# Patient Record
Sex: Female | Born: 1970
Health system: Southern US, Community
[De-identification: ages and names within clinical notes are randomized; demographics above are authoritative.]

## PROBLEM LIST (undated history)

## (undated) DIAGNOSIS — E785 Hyperlipidemia, unspecified: Secondary | ICD-10-CM

## (undated) DIAGNOSIS — M109 Gout, unspecified: Secondary | ICD-10-CM

## (undated) DIAGNOSIS — R51 Headache: Secondary | ICD-10-CM

## (undated) DIAGNOSIS — E559 Vitamin D deficiency, unspecified: Secondary | ICD-10-CM

## (undated) DIAGNOSIS — I10 Essential (primary) hypertension: Secondary | ICD-10-CM

## (undated) DIAGNOSIS — K219 Gastro-esophageal reflux disease without esophagitis: Secondary | ICD-10-CM

## (undated) DIAGNOSIS — F419 Anxiety disorder, unspecified: Principal | ICD-10-CM

## (undated) DIAGNOSIS — E039 Hypothyroidism, unspecified: Secondary | ICD-10-CM

## (undated) DIAGNOSIS — F329 Major depressive disorder, single episode, unspecified: Secondary | ICD-10-CM

## (undated) DIAGNOSIS — Z87442 Personal history of urinary calculi: Secondary | ICD-10-CM

## (undated) DIAGNOSIS — E119 Type 2 diabetes mellitus without complications: Secondary | ICD-10-CM

## (undated) DIAGNOSIS — E669 Obesity, unspecified: Secondary | ICD-10-CM

## (undated) DIAGNOSIS — Z309 Encounter for contraceptive management, unspecified: Secondary | ICD-10-CM

## (undated) HISTORY — DX: Headache: R51

## (undated) HISTORY — DX: Major depressive disorder, single episode, unspecified: F32.9

## (undated) HISTORY — DX: Hyperlipidemia, unspecified: E78.5

## (undated) HISTORY — DX: Gout, unspecified: M10.9

## (undated) HISTORY — DX: Encounter for contraceptive management, unspecified: Z30.9

## (undated) HISTORY — PX: CHOLECYSTECTOMY: SHX55

## (undated) HISTORY — DX: Essential (primary) hypertension: I10

## (undated) HISTORY — DX: Anxiety disorder, unspecified: F41.9

## (undated) HISTORY — DX: Vitamin D deficiency, unspecified: E55.9

## (undated) HISTORY — DX: Hypothyroidism, unspecified: E03.9

## (undated) HISTORY — DX: Type 2 diabetes mellitus without complications: E11.9

## (undated) HISTORY — DX: Obesity, unspecified: E66.9

## (undated) HISTORY — PX: GALLBLADDER SURGERY: SHX652

---

## 2003-03-15 ENCOUNTER — Ambulatory Visit (HOSPITAL_COMMUNITY): Admission: RE | Admit: 2003-03-15 | Discharge: 2003-03-15 | Payer: Self-pay | Admitting: Family Medicine

## 2004-06-16 ENCOUNTER — Encounter: Admission: RE | Admit: 2004-06-16 | Discharge: 2004-08-18 | Payer: Self-pay | Admitting: Physician Assistant

## 2004-08-18 ENCOUNTER — Encounter: Admission: RE | Admit: 2004-08-18 | Discharge: 2004-11-16 | Payer: Self-pay | Admitting: Physician Assistant

## 2004-11-01 ENCOUNTER — Ambulatory Visit (HOSPITAL_COMMUNITY): Admission: RE | Admit: 2004-11-01 | Discharge: 2004-11-01 | Payer: Self-pay | Admitting: Family Medicine

## 2004-11-17 ENCOUNTER — Encounter: Admission: RE | Admit: 2004-11-17 | Discharge: 2005-02-15 | Payer: Self-pay | Admitting: Physician Assistant

## 2005-02-16 ENCOUNTER — Encounter: Admission: RE | Admit: 2005-02-16 | Discharge: 2005-05-17 | Payer: Self-pay | Admitting: Physician Assistant

## 2005-05-18 ENCOUNTER — Encounter: Admission: RE | Admit: 2005-05-18 | Discharge: 2005-08-16 | Payer: Self-pay | Admitting: Physician Assistant

## 2007-07-27 ENCOUNTER — Other Ambulatory Visit: Admission: RE | Admit: 2007-07-27 | Discharge: 2007-07-27 | Payer: Self-pay | Admitting: Obstetrics and Gynecology

## 2008-08-01 ENCOUNTER — Other Ambulatory Visit: Admission: RE | Admit: 2008-08-01 | Discharge: 2008-08-01 | Payer: Self-pay | Admitting: Obstetrics and Gynecology

## 2008-11-09 ENCOUNTER — Emergency Department (HOSPITAL_COMMUNITY): Admission: EM | Admit: 2008-11-09 | Discharge: 2008-11-09 | Payer: Self-pay | Admitting: Family Medicine

## 2009-08-14 ENCOUNTER — Other Ambulatory Visit: Admission: RE | Admit: 2009-08-14 | Discharge: 2009-08-14 | Payer: Self-pay | Admitting: Obstetrics and Gynecology

## 2010-07-17 LAB — POCT URINALYSIS DIP (DEVICE)
Glucose, UA: 250 mg/dL — AB
Ketones, ur: 15 mg/dL — AB
Nitrite: POSITIVE — AB
Protein, ur: 100 mg/dL — AB
Specific Gravity, Urine: 1.015 (ref 1.005–1.030)
Urobilinogen, UA: 4 mg/dL — ABNORMAL HIGH (ref 0.0–1.0)
pH: 5 (ref 5.0–8.0)

## 2010-07-17 LAB — POCT PREGNANCY, URINE: Preg Test, Ur: NEGATIVE

## 2010-07-17 LAB — URINE CULTURE: Colony Count: 2000

## 2010-08-20 ENCOUNTER — Other Ambulatory Visit (HOSPITAL_COMMUNITY)
Admission: RE | Admit: 2010-08-20 | Discharge: 2010-08-20 | Disposition: A | Discharge status: Home / Self Care | Payer: BC Managed Care – PPO | Source: Ambulatory Visit | Attending: Obstetrics and Gynecology | Admitting: Obstetrics and Gynecology

## 2010-08-20 DIAGNOSIS — Z01419 Encounter for gynecological examination (general) (routine) without abnormal findings: Secondary | ICD-10-CM | POA: Insufficient documentation

## 2012-10-04 ENCOUNTER — Encounter: Payer: Self-pay | Admitting: *Deleted

## 2012-10-05 ENCOUNTER — Encounter: Payer: Self-pay | Admitting: Adult Health

## 2012-10-05 ENCOUNTER — Other Ambulatory Visit (HOSPITAL_COMMUNITY)
Admission: RE | Admit: 2012-10-05 | Discharge: 2012-10-05 | Disposition: A | Discharge status: Home / Self Care | Payer: BC Managed Care – PPO | Source: Ambulatory Visit | Attending: Adult Health | Admitting: Adult Health

## 2012-10-05 ENCOUNTER — Ambulatory Visit (INDEPENDENT_AMBULATORY_CARE_PROVIDER_SITE_OTHER): Payer: BC Managed Care – PPO | Admitting: Adult Health

## 2012-10-05 VITALS — BP 130/90 | HR 72 | Ht 71.5 in | Wt 382.2 lb

## 2012-10-05 DIAGNOSIS — Z309 Encounter for contraceptive management, unspecified: Secondary | ICD-10-CM

## 2012-10-05 DIAGNOSIS — E7800 Pure hypercholesterolemia, unspecified: Secondary | ICD-10-CM

## 2012-10-05 DIAGNOSIS — I10 Essential (primary) hypertension: Secondary | ICD-10-CM | POA: Insufficient documentation

## 2012-10-05 DIAGNOSIS — Z1212 Encounter for screening for malignant neoplasm of rectum: Secondary | ICD-10-CM

## 2012-10-05 DIAGNOSIS — Z1151 Encounter for screening for human papillomavirus (HPV): Secondary | ICD-10-CM | POA: Insufficient documentation

## 2012-10-05 DIAGNOSIS — Z01419 Encounter for gynecological examination (general) (routine) without abnormal findings: Secondary | ICD-10-CM

## 2012-10-05 DIAGNOSIS — E039 Hypothyroidism, unspecified: Secondary | ICD-10-CM | POA: Insufficient documentation

## 2012-10-05 LAB — LIPID PANEL
Cholesterol: 185 mg/dL (ref 0–200)
HDL: 62 mg/dL (ref >39)
LDL Cholesterol: 93 mg/dL (ref 0–99)
Total CHOL/HDL Ratio: 3 Ratio
Triglycerides: 152 mg/dL — ABNORMAL HIGH (ref <150)
VLDL: 30 mg/dL (ref 0–40)

## 2012-10-05 LAB — COMPREHENSIVE METABOLIC PANEL
ALT: 15 U/L (ref 0–35)
AST: 16 U/L (ref 0–37)
Albumin: 3.7 g/dL (ref 3.5–5.2)
Alkaline Phosphatase: 39 U/L (ref 39–117)
BUN: 10 mg/dL (ref 6–23)
CO2: 23 mEq/L (ref 19–32)
Calcium: 9.1 mg/dL (ref 8.4–10.5)
Chloride: 105 mEq/L (ref 96–112)
Creat: 0.83 mg/dL (ref 0.50–1.10)
Glucose, Bld: 96 mg/dL (ref 70–99)
Potassium: 4.4 mEq/L (ref 3.5–5.3)
Sodium: 138 mEq/L (ref 135–145)
Total Bilirubin: 0.5 mg/dL (ref 0.3–1.2)
Total Protein: 6.4 g/dL (ref 6.0–8.3)

## 2012-10-05 LAB — CBC
HCT: 42.4 % (ref 36.0–46.0)
Hemoglobin: 14.3 g/dL (ref 12.0–15.0)
MCH: 29.1 pg (ref 26.0–34.0)
MCHC: 33.7 g/dL (ref 30.0–36.0)
MCV: 86.4 fL (ref 78.0–100.0)
Platelets: 250 10*3/uL (ref 150–400)
RBC: 4.91 MIL/uL (ref 3.87–5.11)
RDW: 13.3 % (ref 11.5–15.5)
WBC: 8.4 10*3/uL (ref 4.0–10.5)

## 2012-10-05 LAB — HEMOCCULT GUIAC POC 1CARD (OFFICE): Fecal Occult Blood, POC: NEGATIVE

## 2012-10-05 LAB — TSH: TSH: 2.042 u[IU]/mL (ref 0.350–4.500)

## 2012-10-05 MED ORDER — NORETHIN-ETH ESTRADIOL-FE 0.4-35 MG-MCG PO CHEW: 1.0000 | CHEWABLE_TABLET | Freq: Every day | ORAL | Status: DC

## 2012-10-05 MED ORDER — LEVOTHYROXINE SODIUM 100 MCG PO TABS: 100.0000 ug | ORAL_TABLET | Freq: Every day | ORAL | Status: DC

## 2012-10-05 MED ORDER — SIMVASTATIN 20 MG PO TABS: 20.0000 mg | ORAL_TABLET | Freq: Every evening | ORAL | Status: DC

## 2012-10-05 NOTE — Patient Instructions (Addendum)
Physical in 1 year Mammogram now and yearly Check BP and call me next week Call for labs next week

## 2012-10-05 NOTE — Progress Notes (Signed)
Patient ID: ISALY FASCHING, female   DOB: 12-17-1970, 42 y.o.   MRN: [id number] History of Present Illness: Dolce is a 42 year old white female, married in for a pap and physical   Current Medications, Allergies, Past Medical History, Past Surgical History, Family History and Social History were reviewed in [employer] record.     Review of Systems: Patient denies any regular headaches, blurred vision, shortness of breath, abdominal pain, problems with bowel movements, urination, or intercourse. She has had pain in left chest but had a negative cardio work up with negative stress test, no recent joint pain but she has a history of gout, she has had some emotional stress, her 8 year old brother died suddenly.    Physical Exam:BP 130/90  Pulse 72  Ht 5' 11.5" (1.816 m)  Wt 382 lb 3.2 oz (173.365 kg)  BMI 52.57 kg/m2 General:  Well developed, well nourished, no acute distress Skin:  Warm and dry Neck:  Midline trachea, normal thyroid Lungs; Clear to auscultation bilaterally Breast:  No dominant palpable mass, retraction, or nipple discharge Cardiovascular: Regular rate and rhythm Abdomen:  Soft, non tender, no hepatosplenomegaly, obese Pelvic:  External genitalia is normal in appearance.  The vagina is normal in appearance. The cervix is bulbous. Pap performed with HPV. Uterus is felt to be normal size, shape, and contour.  No adnexal masses or tenderness noted. Rectal: Good sphincter tone, no polyps, or hemorrhoids felt.  Hemoccult negative. Extremities:  No swelling or varicosities noted Psych:  Alert and cooperative, seems happy   Impression: Yearly gyn exam Contraceptive management Hypothyroid Hypertension Obesity Elevated cholesterol History gout    Plan: Physical in 1 year Mammogram now and yearly Check CBC,CMP,TSH,and lipid profile Refilled Synthroid 100 mcg 1 daily x 1 year,Zocor 20 mg 1 daily x 1 year and Femcon 1 daily x 1 year Call for  lab results next week or check my chart Check BP at work and call results to me may need to go on medication

## 2012-10-08 ENCOUNTER — Telehealth: Payer: Self-pay | Admitting: Adult Health

## 2012-10-08 NOTE — Telephone Encounter (Signed)
Left message to call in am about labs 

## 2012-10-09 ENCOUNTER — Telehealth: Payer: Self-pay | Admitting: Adult Health

## 2012-10-09 NOTE — Telephone Encounter (Signed)
Pt aware of labs  

## 2012-10-23 ENCOUNTER — Telehealth: Payer: Self-pay | Admitting: Adult Health

## 2012-10-23 MED ORDER — HYDROCHLOROTHIAZIDE 12.5 MG PO CAPS: 12.5000 mg | ORAL_CAPSULE | Freq: Every day | ORAL | Status: DC

## 2012-10-23 NOTE — Telephone Encounter (Signed)
Called Natalie Hernandez in follow up of BP has had some variations from 150/90 to like 80/60 but has some swelling in ankles and feels OK. Will Rx HCTZ 12.5 mg 1 daily and keep check on BP.

## 2012-12-12 ENCOUNTER — Telehealth: Payer: Self-pay | Admitting: Adult Health

## 2012-12-12 NOTE — Telephone Encounter (Signed)
Taking HCTZ and BP good and no swelling

## 2013-01-09 ENCOUNTER — Encounter: Payer: Self-pay | Admitting: Adult Health

## 2013-01-23 ENCOUNTER — Telehealth: Payer: Self-pay | Admitting: Adult Health

## 2013-01-23 MED ORDER — LISINOPRIL 10 MG PO TABS: 10.0000 mg | ORAL_TABLET | Freq: Every day | ORAL | Status: DC

## 2013-01-23 NOTE — Addendum Note (Signed)
Addended by: Cyril Mourning A on: 01/23/2013 12:08 PM   Modules accepted: Orders, Medications

## 2013-01-23 NOTE — Telephone Encounter (Signed)
Left message to call back the fluid pill can cause flare of gout

## 2013-01-23 NOTE — Telephone Encounter (Signed)
Pt had gout will stop HCTZ and rx lisinopril check BP at work and call me with results

## 2013-02-14 ENCOUNTER — Other Ambulatory Visit: Payer: Self-pay

## 2013-08-05 ENCOUNTER — Encounter: Payer: Self-pay | Admitting: Adult Health

## 2013-08-07 ENCOUNTER — Other Ambulatory Visit: Payer: Self-pay | Admitting: Adult Health

## 2013-08-07 DIAGNOSIS — E669 Obesity, unspecified: Secondary | ICD-10-CM

## 2013-09-11 ENCOUNTER — Encounter: Payer: Self-pay | Admitting: Adult Health

## 2013-09-12 ENCOUNTER — Other Ambulatory Visit: Payer: Self-pay | Admitting: Adult Health

## 2013-09-12 MED ORDER — NORETHIN-ETH ESTRADIOL-FE 0.4-35 MG-MCG PO CHEW: 1.0000 | CHEWABLE_TABLET | Freq: Every day | ORAL | Status: DC

## 2013-09-13 ENCOUNTER — Other Ambulatory Visit: Payer: Self-pay

## 2013-09-13 DIAGNOSIS — Z1231 Encounter for screening mammogram for malignant neoplasm of breast: Secondary | ICD-10-CM

## 2013-09-17 ENCOUNTER — Ambulatory Visit
Admission: RE | Admit: 2013-09-17 | Discharge: 2013-09-17 | Disposition: A | Discharge status: Home / Self Care | Payer: BC Managed Care – PPO | Source: Ambulatory Visit

## 2013-09-17 DIAGNOSIS — Z1231 Encounter for screening mammogram for malignant neoplasm of breast: Secondary | ICD-10-CM

## 2013-10-25 ENCOUNTER — Ambulatory Visit (INDEPENDENT_AMBULATORY_CARE_PROVIDER_SITE_OTHER): Payer: BC Managed Care – PPO | Admitting: Adult Health

## 2013-10-25 ENCOUNTER — Encounter: Payer: Self-pay | Admitting: Adult Health

## 2013-10-25 ENCOUNTER — Other Ambulatory Visit: Payer: Self-pay | Admitting: Adult Health

## 2013-10-25 VITALS — BP 138/80 | HR 78 | Ht 72.25 in | Wt >= 6400 oz

## 2013-10-25 DIAGNOSIS — Z3041 Encounter for surveillance of contraceptive pills: Secondary | ICD-10-CM

## 2013-10-25 DIAGNOSIS — Z01419 Encounter for gynecological examination (general) (routine) without abnormal findings: Secondary | ICD-10-CM

## 2013-10-25 DIAGNOSIS — E785 Hyperlipidemia, unspecified: Secondary | ICD-10-CM

## 2013-10-25 DIAGNOSIS — E669 Obesity, unspecified: Secondary | ICD-10-CM

## 2013-10-25 DIAGNOSIS — Z1212 Encounter for screening for malignant neoplasm of rectum: Secondary | ICD-10-CM

## 2013-10-25 DIAGNOSIS — Z309 Encounter for contraceptive management, unspecified: Secondary | ICD-10-CM | POA: Insufficient documentation

## 2013-10-25 DIAGNOSIS — I10 Essential (primary) hypertension: Secondary | ICD-10-CM

## 2013-10-25 DIAGNOSIS — E039 Hypothyroidism, unspecified: Secondary | ICD-10-CM

## 2013-10-25 HISTORY — DX: Encounter for contraceptive management, unspecified: Z30.9

## 2013-10-25 HISTORY — DX: Hyperlipidemia, unspecified: E78.5

## 2013-10-25 LAB — COMPREHENSIVE METABOLIC PANEL
ALT: 21 U/L (ref 0–35)
AST: 26 U/L (ref 0–37)
Albumin: 3.4 g/dL — ABNORMAL LOW (ref 3.5–5.2)
Alkaline Phosphatase: 42 U/L (ref 39–117)
BUN: 7 mg/dL (ref 6–23)
CO2: 27 mEq/L (ref 19–32)
Calcium: 8.6 mg/dL (ref 8.4–10.5)
Chloride: 101 mEq/L (ref 96–112)
Creat: 0.83 mg/dL (ref 0.50–1.10)
Glucose, Bld: 111 mg/dL — ABNORMAL HIGH (ref 70–99)
Potassium: 4.3 mEq/L (ref 3.5–5.3)
Sodium: 137 mEq/L (ref 135–145)
Total Bilirubin: 0.4 mg/dL (ref 0.2–1.2)
Total Protein: 6 g/dL (ref 6.0–8.3)

## 2013-10-25 LAB — HEMOCCULT GUIAC POC 1CARD (OFFICE): Fecal Occult Blood, POC: NEGATIVE

## 2013-10-25 LAB — CBC
HCT: 40.8 % (ref 36.0–46.0)
Hemoglobin: 13.4 g/dL (ref 12.0–15.0)
MCH: 29 pg (ref 26.0–34.0)
MCHC: 32.8 g/dL (ref 30.0–36.0)
MCV: 88.3 fL (ref 78.0–100.0)
Platelets: 208 10*3/uL (ref 150–400)
RBC: 4.62 MIL/uL (ref 3.87–5.11)
RDW: 13.6 % (ref 11.5–15.5)
WBC: 6.2 10*3/uL (ref 4.0–10.5)

## 2013-10-25 LAB — TSH: TSH: 3.243 u[IU]/mL (ref 0.350–4.500)

## 2013-10-25 LAB — LIPID PANEL
Cholesterol: 188 mg/dL (ref 0–200)
HDL: 61 mg/dL (ref >39)
LDL Cholesterol: 92 mg/dL (ref 0–99)
Total CHOL/HDL Ratio: 3.1 Ratio
Triglycerides: 174 mg/dL — ABNORMAL HIGH (ref <150)
VLDL: 35 mg/dL (ref 0–40)

## 2013-10-25 NOTE — Patient Instructions (Signed)
Physical in 1 year  mammogram yearly Think about weight loss

## 2013-10-25 NOTE — Progress Notes (Signed)
Patient ID: Natalie Hernandez, female   DOB: 09-08-1970, 43 y.o.   MRN: [id number] History of Present Illness: Natalie Hernandez is a 43 year old white female married,in for physical. She had a normal pap with negative HPV 10/05/12.No complaints except needs to get weight under control.   Current Medications, Allergies, Past Medical History, Past Surgical History, Family History and Social History were reviewed in [employer] record.     Review of Systems: Patient denies any headaches, blurred vision, shortness of breath, chest pain, abdominal pain, problems with bowel movements, urination, or intercourse. No joint pain or mood swings.See HPI.    Physical Exam:BP 138/80  Pulse 78  Ht 6' 0.25" (1.835 m)  Wt 407 lb 3.2 oz (184.705 kg)  BMI 54.85 kg/m2 General:  Well developed, well nourished, no acute distress Skin:  Warm and dry Neck:  Midline trachea, normal thyroid Lungs; Clear to auscultation bilaterally Breast:  No dominant palpable mass, retraction, or nipple discharge Cardiovascular: Regular rate and rhythm Abdomen:  Soft, non tender, no hepatosplenomegaly,obese Pelvic:  External genitalia is normal in appearance.  The vagina is normal in appearance.The cervix is bulbous.  Uterus is felt to be normal size, shape, and contour.  No  adnexal masses or tenderness noted.Difficult secondary to abdominal girth. Rectal: Good sphincter tone, no polyps, or hemorrhoids felt.  Hemoccult negative. Extremities:  No swelling or varicosities noted Psych:  No mood changes, alert and cooperative, seems happy, but wants to lose weight   Impression: Yearly gyn exam no pap Hypertension Hypothyroid Obesity Dyslipidemia Contraceptive management    Plan: Check CBC,CMP,TSH and lipids Physical in  1 year Mammogram yearly Think about weight loss, Whole 30,belviq, bariatric center in [place] and bariatric surgery (she is scared of surgery, but call and just talk) Continue  meds, will refill as needed

## 2013-10-28 ENCOUNTER — Telehealth: Payer: Self-pay | Admitting: Adult Health

## 2013-10-28 LAB — HEMOGLOBIN A1C
Hgb A1c MFr Bld: 6.7 % — ABNORMAL HIGH (ref <5.7)
Mean Plasma Glucose: 146 mg/dL — ABNORMAL HIGH (ref <117)

## 2013-10-28 MED ORDER — LEVOTHYROXINE SODIUM 125 MCG PO CAPS: 125.0000 ug | ORAL_CAPSULE | Freq: Every day | ORAL | Status: DC

## 2013-10-28 NOTE — Telephone Encounter (Signed)
Pt aware of labs TSH 3.243 will increase levothyroxine to 125 mcg daily and recheck in  8 weeks and pt aware BS 111 and will ad A1c

## 2013-10-29 ENCOUNTER — Telehealth: Payer: Self-pay | Admitting: Adult Health

## 2013-10-29 NOTE — Telephone Encounter (Signed)
Left message A1c 6.7 will need diet changes and exercise, may RX metformin but want to talk first, call back

## 2013-11-04 ENCOUNTER — Telehealth: Payer: Self-pay | Admitting: Adult Health

## 2013-11-04 ENCOUNTER — Encounter: Payer: Self-pay | Admitting: Adult Health

## 2013-11-04 DIAGNOSIS — E119 Type 2 diabetes mellitus without complications: Secondary | ICD-10-CM | POA: Insufficient documentation

## 2013-11-04 HISTORY — DX: Type 2 diabetes mellitus without complications: E11.9

## 2013-11-04 MED ORDER — METFORMIN HCL 500 MG PO TABS: 500.0000 mg | ORAL_TABLET | Freq: Every day | ORAL | Status: DC

## 2013-11-04 NOTE — Telephone Encounter (Signed)
Left message to call back  

## 2013-11-04 NOTE — Telephone Encounter (Signed)
Pt aware that  A1c 6.7, increase exercise, decrease carbs and will Rx metformin 500 mg #30 1 daily in am(has loose BM, will start low and slow)recheck A1c and mircoalbumin in 3 months, will refer to dietician after follow up if not doing well, has large deductible

## 2013-12-27 ENCOUNTER — Other Ambulatory Visit: Payer: BC Managed Care – PPO

## 2013-12-27 DIAGNOSIS — E138 Other specified diabetes mellitus with unspecified complications: Secondary | ICD-10-CM

## 2013-12-27 DIAGNOSIS — E039 Hypothyroidism, unspecified: Secondary | ICD-10-CM

## 2013-12-27 NOTE — Addendum Note (Signed)
Addended by: Linton Rump on: 12/27/2013 09:10 AM   Modules accepted: Orders

## 2013-12-28 LAB — TSH: TSH: 1.874 u[IU]/mL (ref 0.350–4.500)

## 2013-12-30 ENCOUNTER — Telehealth: Payer: Self-pay | Admitting: Adult Health

## 2013-12-30 NOTE — Telephone Encounter (Signed)
Pt aware of TSH 1.874 which is great, continue current dose and has lost 20 lbs and seeing dietician

## 2014-02-07 ENCOUNTER — Other Ambulatory Visit: Payer: BC Managed Care – PPO

## 2014-02-07 ENCOUNTER — Other Ambulatory Visit: Payer: Self-pay | Admitting: Adult Health

## 2014-02-07 DIAGNOSIS — E139 Other specified diabetes mellitus without complications: Secondary | ICD-10-CM

## 2014-02-07 MED ORDER — SIMVASTATIN 20 MG PO TABS: 20.0000 mg | ORAL_TABLET | Freq: Every evening | ORAL | Status: DC

## 2014-02-07 NOTE — Telephone Encounter (Signed)
[facility] approved refill. Encounter closed. [place]

## 2014-02-08 LAB — HEMOGLOBIN A1C
Hgb A1c MFr Bld: 6.1 % — ABNORMAL HIGH (ref <5.7)
Mean Plasma Glucose: 128 mg/dL — ABNORMAL HIGH (ref <117)

## 2014-02-10 ENCOUNTER — Other Ambulatory Visit: Payer: Self-pay | Admitting: Adult Health

## 2014-02-10 ENCOUNTER — Telehealth: Payer: Self-pay | Admitting: Adult Health

## 2014-02-10 ENCOUNTER — Encounter: Payer: Self-pay | Admitting: Adult Health

## 2014-02-10 NOTE — Telephone Encounter (Signed)
Pt aware A1c is better at 6.1 was 6.7 3 months ago,keep doing good and recheck in 3 months

## 2014-07-31 DIAGNOSIS — Z6841 Body Mass Index (BMI) 40.0 and over, adult: Secondary | ICD-10-CM | POA: Insufficient documentation

## 2014-07-31 DIAGNOSIS — E785 Hyperlipidemia, unspecified: Secondary | ICD-10-CM | POA: Insufficient documentation

## 2014-07-31 DIAGNOSIS — M1A09X0 Idiopathic chronic gout, multiple sites, without tophus (tophi): Secondary | ICD-10-CM | POA: Insufficient documentation

## 2014-08-30 ENCOUNTER — Other Ambulatory Visit: Payer: Self-pay | Admitting: Adult Health

## 2014-09-02 ENCOUNTER — Other Ambulatory Visit: Payer: Self-pay | Admitting: Adult Health

## 2014-10-06 ENCOUNTER — Other Ambulatory Visit: Payer: Self-pay

## 2014-10-14 ENCOUNTER — Other Ambulatory Visit: Payer: Self-pay | Admitting: Adult Health

## 2014-10-31 ENCOUNTER — Other Ambulatory Visit: Payer: Self-pay | Admitting: Adult Health

## 2014-11-20 ENCOUNTER — Other Ambulatory Visit: Payer: Self-pay | Admitting: Adult Health

## 2014-12-19 ENCOUNTER — Ambulatory Visit (INDEPENDENT_AMBULATORY_CARE_PROVIDER_SITE_OTHER): Payer: BLUE CROSS/BLUE SHIELD | Admitting: Adult Health

## 2014-12-19 ENCOUNTER — Encounter: Payer: Self-pay | Admitting: Adult Health

## 2014-12-19 VITALS — BP 110/66 | HR 68 | Ht 71.0 in | Wt 339.5 lb

## 2014-12-19 DIAGNOSIS — Z01419 Encounter for gynecological examination (general) (routine) without abnormal findings: Secondary | ICD-10-CM

## 2014-12-19 DIAGNOSIS — Z1212 Encounter for screening for malignant neoplasm of rectum: Secondary | ICD-10-CM | POA: Diagnosis not present

## 2014-12-19 DIAGNOSIS — Z3041 Encounter for surveillance of contraceptive pills: Secondary | ICD-10-CM

## 2014-12-19 LAB — HEMOCCULT GUIAC POC 1CARD (OFFICE): FECAL OCCULT BLD: NEGATIVE

## 2014-12-19 MED ORDER — NORETHINDRONE-ETH ESTRADIOL 0.4-35 MG-MCG PO TABS: ORAL_TABLET | ORAL | Status: DC

## 2014-12-19 NOTE — Progress Notes (Signed)
Patient ID: Natalie Hernandez, female   DOB: Feb 17, 1971, 44 y.o.   MRN: [id number] History of Present Illness: Natalie Hernandez is a 44 year old white female, married in for well woman gyn exam, she had a normal pap with negative HPV 10/05/12.   Current Medications, Allergies, Past Medical History, Past Surgical History, Family History and Social History were reviewed in [employer] record.     Review of Systems: Patient denies any headaches, hearing loss, fatigue, blurred vision, shortness of breath, chest pain, abdominal pain, problems with bowel movements, urination, or intercourse. No joint pain today (but does get gout) or mood swings.She has had emotional last 4 months, brother was shot and killed in May, and she started a weight loss program in April and has lost 70 lbs. She is happy with her OCs, which she takes continuously so has no period.  Physical Exam:BP 110/66 mmHg  Pulse 68  Ht 5\' 11"  (1.803 m)  Wt 339 lb 8 oz (153.996 kg)  BMI 47.37 kg/m2 General:  Well developed, well nourished, no acute distress Skin:  Warm and dry Neck:  Midline trachea, normal thyroid, good ROM, no lymphadenopathy Lungs; Clear to auscultation bilaterally Breast:  No dominant palpable mass, retraction, or nipple discharge Cardiovascular: Regular rate and rhythm Abdomen:  Soft, non tender, no hepatosplenomegaly Pelvic:  External genitalia is normal in appearance, no lesions.  The vagina is normal in appearance. Urethra has no lesions or masses. The cervix is bulbous.  Uterus is felt to be normal size, shape, and contour.  No adnexal masses or tenderness noted.Bladder is non tender, no masses felt. Rectal: Good sphincter tone, no polyps, or hemorrhoids felt.  Hemoccult negative. Extremities/musculoskeletal:  No swelling or varicosities noted, no clubbing or cyanosis Psych:  No mood changes, alert and cooperative,seems happy   Impression: Well woman gyn exam no pap Contraceptive management      Plan: Refilled zenchent take 1 daily, #4 packs with 4 refills Pap and physical in 1 year Get mammogram  Had labs at [facility] weight management, where doing optifast

## 2014-12-19 NOTE — Patient Instructions (Signed)
Get mammogram Pap and physical in 1 year

## 2015-04-11 ENCOUNTER — Other Ambulatory Visit: Payer: Self-pay | Admitting: Adult Health

## 2015-05-20 ENCOUNTER — Other Ambulatory Visit: Payer: Self-pay | Admitting: Adult Health

## 2015-06-25 ENCOUNTER — Encounter: Payer: Self-pay | Admitting: Adult Health

## 2015-07-01 ENCOUNTER — Ambulatory Visit (INDEPENDENT_AMBULATORY_CARE_PROVIDER_SITE_OTHER): Payer: BLUE CROSS/BLUE SHIELD | Admitting: Adult Health

## 2015-07-01 ENCOUNTER — Encounter: Payer: Self-pay | Admitting: Adult Health

## 2015-07-01 VITALS — BP 132/90 | HR 60 | Ht 72.0 in | Wt 313.0 lb

## 2015-07-01 DIAGNOSIS — F419 Anxiety disorder, unspecified: Secondary | ICD-10-CM | POA: Insufficient documentation

## 2015-07-01 DIAGNOSIS — F32A Depression, unspecified: Secondary | ICD-10-CM | POA: Insufficient documentation

## 2015-07-01 DIAGNOSIS — F418 Other specified anxiety disorders: Secondary | ICD-10-CM | POA: Diagnosis not present

## 2015-07-01 DIAGNOSIS — F329 Major depressive disorder, single episode, unspecified: Secondary | ICD-10-CM | POA: Insufficient documentation

## 2015-07-01 HISTORY — DX: Depression, unspecified: F32.A

## 2015-07-01 HISTORY — DX: Anxiety disorder, unspecified: F41.9

## 2015-07-01 MED ORDER — CITALOPRAM HYDROBROMIDE 10 MG PO TABS: 10.0000 mg | ORAL_TABLET | Freq: Every day | ORAL | Status: DC

## 2015-07-01 MED ORDER — ALPRAZOLAM 0.25 MG PO TABS: 0.2500 mg | ORAL_TABLET | Freq: Two times a day (BID) | ORAL | Status: DC | PRN

## 2015-07-01 NOTE — Patient Instructions (Signed)
Take celexa 1 daily Follow up in 2 months Use xanax prn Major Depressive Disorder Major depressive disorder is a mental illness. It also may be called clinical depression or unipolar depression. Major depressive disorder usually causes feelings of sadness, hopelessness, or helplessness. Some people with this disorder do not feel particularly sad but lose interest in doing things they used to enjoy (anhedonia). Major depressive disorder also can cause physical symptoms. It can interfere with work, school, relationships, and other normal everyday activities. The disorder varies in severity but is longer lasting and more serious than the sadness we all feel from time to time in our lives. Major depressive disorder often is triggered by stressful life events or major life changes. Examples of these triggers include divorce, loss of your job or home, a move, and the death of a family member or close friend. Sometimes this disorder occurs for no obvious reason at all. People who have family members with major depressive disorder or bipolar disorder are at higher risk for developing this disorder, with or without life stressors. Major depressive disorder can occur at any age. It may occur just once in your life (single episode major depressive disorder). It may occur multiple times (recurrent major depressive disorder). SYMPTOMS People with major depressive disorder have either anhedonia or depressed mood on nearly a daily basis for at least 2 weeks or longer. Symptoms of depressed mood include:  Feelings of sadness (blue or down in the dumps) or emptiness.  Feelings of hopelessness or helplessness.  Tearfulness or episodes of crying (may be observed by others).  Irritability (children and adolescents). In addition to depressed mood or anhedonia or both, people with this disorder have at least four of the following symptoms:  Difficulty sleeping or sleeping too much.   Significant change (increase or  decrease) in appetite or weight.   Lack of energy or motivation.  Feelings of guilt and worthlessness.   Difficulty concentrating, remembering, or making decisions.  Unusually slow movement (psychomotor retardation) or restlessness (as observed by others).   Recurrent wishes for death, recurrent thoughts of self-harm (suicide), or a suicide attempt. People with major depressive disorder commonly have persistent negative thoughts about themselves, other people, and the world. People with severe major depressive disorder may experiencedistorted beliefs or perceptions about the world (psychotic delusions). They also may see or hear things that are not real (psychotic hallucinations). DIAGNOSIS Major depressive disorder is diagnosed through an assessment by your health care provider. Your health care provider will ask aboutaspects of your daily life, such as mood,sleep, and appetite, to see if you have the diagnostic symptoms of major depressive disorder. Your health care provider may ask about your medical history and use of alcohol or drugs, including prescription medicines. Your health care provider also may do a physical exam and blood work. This is because certain medical conditions and the use of certain substances can cause major depressive disorder-like symptoms (secondary depression). Your health care provider also may refer you to a mental health specialist for further evaluation and treatment. TREATMENT It is important to recognize the symptoms of major depressive disorder and seek treatment. The following treatments can be prescribed for this disorder:   Medicine. Antidepressant medicines usually are prescribed. Antidepressant medicines are thought to correct chemical imbalances in the brain that are commonly associated with major depressive disorder. Other types of medicine may be added if the symptoms do not respond to antidepressant medicines alone or if psychotic delusions or  hallucinations occur.  Talk therapy.  Talk therapy can be helpful in treating major depressive disorder by providing support, education, and guidance. Certain types of talk therapy also can help with negative thinking (cognitive behavioral therapy) and with relationship issues that trigger this disorder (interpersonal therapy). A mental health specialist can help determine which treatment is best for you. Most people with major depressive disorder do well with a combination of medicine and talk therapy. Treatments involving electrical stimulation of the brain can be used in situations with extremely severe symptoms or when medicine and talk therapy do not work over time. These treatments include electroconvulsive therapy, transcranial magnetic stimulation, and vagal nerve stimulation.   This information is not intended to replace advice given to you by your health care provider. Make sure you discuss any questions you have with your health care provider.   Document Released: 07/23/2012 Document Revised: 04/18/2014 Document Reviewed: 07/23/2012 Elsevier Interactive Patient Education [employer].

## 2015-07-01 NOTE — Progress Notes (Signed)
Subjective:     Patient ID: Natalie Hernandez, female   DOB: 1971/02/04, 45 y.o.   MRN: [id number]  HPI Natalie Hernandez is 45 year old white female, married in to discuss taking xanax for anxiety and some depression, there are days she is angry or teary and then she turns to food for comfort and she is trying to lose weight, she has taken xanax in the past and it helped.  Review of Systems + anxiety and teary. May have some depression Reviewed past medical,surgical, social and family history. Reviewed medications and allergies.     Objective:   Physical Exam BP 132/90 mmHg  Pulse 60  Ht 6' (1.829 m)  Wt 313 lb (141.976 kg)  BMI 42.44 kg/m2   PHQ 9 score 11, pt is teary and anxious of brothers up coming death anniversary, he was shot and killed last May.Discussed taking something every day and having xanax, when really having bad day, she says wellbutrin made her grind her teeth, when used for postpartum depression.Will rx celexa 10 mg and xanax, offered if not better, maybe adding  counseling would help.Will see back in 2 months for the death anniversary to see how she is doing.She denies any suicidal ideations.  Face time 15 minutes counseling and talking.  Assessment:     Anxiety and depression    Plan:     Rx celexa 10 mg #30 take 1 daily with 3 refills Rx xanax 0.25 mg #60 take 1 bid prn  Follow up in 2 months Review handout on depression

## 2015-07-06 ENCOUNTER — Encounter: Payer: Self-pay | Admitting: Adult Health

## 2015-07-21 ENCOUNTER — Ambulatory Visit: Payer: BLUE CROSS/BLUE SHIELD | Admitting: Adult Health

## 2015-08-31 ENCOUNTER — Ambulatory Visit (INDEPENDENT_AMBULATORY_CARE_PROVIDER_SITE_OTHER): Payer: BLUE CROSS/BLUE SHIELD | Admitting: Adult Health

## 2015-08-31 ENCOUNTER — Encounter: Payer: Self-pay | Admitting: Adult Health

## 2015-08-31 VITALS — BP 144/90 | HR 78 | Ht 72.0 in | Wt 322.2 lb

## 2015-08-31 DIAGNOSIS — F419 Anxiety disorder, unspecified: Principal | ICD-10-CM

## 2015-08-31 DIAGNOSIS — F338 Other recurrent depressive disorders: Secondary | ICD-10-CM

## 2015-08-31 DIAGNOSIS — F32A Depression, unspecified: Secondary | ICD-10-CM

## 2015-08-31 DIAGNOSIS — F329 Major depressive disorder, single episode, unspecified: Secondary | ICD-10-CM

## 2015-08-31 NOTE — Progress Notes (Addendum)
Subjective:     Patient ID: Natalie Hernandez, female   DOB: Aug 11, 1970, 45 y.o.   MRN: [id number]  HPI Natalie Hernandez is a 46 year old white female back in follow up of trying celexa and she stopped it due to rash, but has used xanax esp at night and feels much better, but death anniversary of brother is next week.She has labs from Dr Asa Lente, her weight loss MD, he has stopped cholesterol meds and BP meds and plans to recheck labs in June to see if her good results ar the meds or her diet.She has gained about 9 lbs in last month when dealing with her emotions, she stopped exercising.But started back today.BP was 134/76 last week at Dr Ard's/  Review of Systems  +anxiety and depression but better. Reviewed past medical,surgical, social and family history. Reviewed medications and allergies.     Objective:   Physical Exam BP 144/90 mmHg  Pulse 78  Ht 6' (1.829 m)  Wt 322 lb 3.2 oz (146.149 kg)  BMI 43.69 kg/m2   Talk only,reviewed labs from Dr Asa Lente, will continue xanax prn and follow up in 4 months for pap and physical, take time for self.  Assessment:     Anxiety and depression     Plan:     Follow up in 4 months for pap and physical Call prn problems or concerns

## 2015-08-31 NOTE — Patient Instructions (Signed)
Pap and physical in 4 months Call prn

## 2015-09-16 DIAGNOSIS — Z713 Dietary counseling and surveillance: Secondary | ICD-10-CM | POA: Diagnosis not present

## 2015-09-24 DIAGNOSIS — Z6841 Body Mass Index (BMI) 40.0 and over, adult: Secondary | ICD-10-CM | POA: Diagnosis not present

## 2015-09-24 DIAGNOSIS — R635 Abnormal weight gain: Secondary | ICD-10-CM | POA: Diagnosis not present

## 2015-09-24 DIAGNOSIS — E119 Type 2 diabetes mellitus without complications: Secondary | ICD-10-CM | POA: Diagnosis not present

## 2015-10-05 DIAGNOSIS — Z713 Dietary counseling and surveillance: Secondary | ICD-10-CM | POA: Diagnosis not present

## 2015-10-29 DIAGNOSIS — E119 Type 2 diabetes mellitus without complications: Secondary | ICD-10-CM | POA: Diagnosis not present

## 2015-10-29 DIAGNOSIS — E785 Hyperlipidemia, unspecified: Secondary | ICD-10-CM | POA: Diagnosis not present

## 2015-10-29 DIAGNOSIS — R635 Abnormal weight gain: Secondary | ICD-10-CM | POA: Diagnosis not present

## 2015-12-02 DIAGNOSIS — R635 Abnormal weight gain: Secondary | ICD-10-CM | POA: Diagnosis not present

## 2015-12-02 DIAGNOSIS — E785 Hyperlipidemia, unspecified: Secondary | ICD-10-CM | POA: Diagnosis not present

## 2015-12-02 DIAGNOSIS — E119 Type 2 diabetes mellitus without complications: Secondary | ICD-10-CM | POA: Diagnosis not present

## 2015-12-07 DIAGNOSIS — F322 Major depressive disorder, single episode, severe without psychotic features: Secondary | ICD-10-CM | POA: Diagnosis not present

## 2015-12-11 DIAGNOSIS — F322 Major depressive disorder, single episode, severe without psychotic features: Secondary | ICD-10-CM | POA: Diagnosis not present

## 2015-12-18 DIAGNOSIS — F329 Major depressive disorder, single episode, unspecified: Secondary | ICD-10-CM | POA: Diagnosis not present

## 2015-12-25 DIAGNOSIS — F322 Major depressive disorder, single episode, severe without psychotic features: Secondary | ICD-10-CM | POA: Diagnosis not present

## 2015-12-31 DIAGNOSIS — Z6841 Body Mass Index (BMI) 40.0 and over, adult: Secondary | ICD-10-CM | POA: Diagnosis not present

## 2015-12-31 DIAGNOSIS — Z713 Dietary counseling and surveillance: Secondary | ICD-10-CM | POA: Diagnosis not present

## 2016-01-01 ENCOUNTER — Encounter: Payer: Self-pay | Admitting: Adult Health

## 2016-01-01 ENCOUNTER — Ambulatory Visit (INDEPENDENT_AMBULATORY_CARE_PROVIDER_SITE_OTHER): Payer: BLUE CROSS/BLUE SHIELD | Admitting: Adult Health

## 2016-01-01 ENCOUNTER — Other Ambulatory Visit (HOSPITAL_COMMUNITY)
Admission: RE | Admit: 2016-01-01 | Discharge: 2016-01-01 | Disposition: A | Discharge status: Home / Self Care | Payer: BLUE CROSS/BLUE SHIELD | Source: Ambulatory Visit | Attending: Adult Health | Admitting: Adult Health

## 2016-01-01 VITALS — BP 122/68 | HR 58 | Ht 71.5 in | Wt 343.2 lb

## 2016-01-01 DIAGNOSIS — Z01419 Encounter for gynecological examination (general) (routine) without abnormal findings: Secondary | ICD-10-CM | POA: Diagnosis not present

## 2016-01-01 DIAGNOSIS — E119 Type 2 diabetes mellitus without complications: Secondary | ICD-10-CM | POA: Diagnosis not present

## 2016-01-01 DIAGNOSIS — Z1151 Encounter for screening for human papillomavirus (HPV): Secondary | ICD-10-CM | POA: Insufficient documentation

## 2016-01-01 DIAGNOSIS — E669 Obesity, unspecified: Secondary | ICD-10-CM | POA: Diagnosis not present

## 2016-01-01 DIAGNOSIS — E039 Hypothyroidism, unspecified: Secondary | ICD-10-CM | POA: Diagnosis not present

## 2016-01-01 DIAGNOSIS — Z1212 Encounter for screening for malignant neoplasm of rectum: Secondary | ICD-10-CM

## 2016-01-01 DIAGNOSIS — Z3041 Encounter for surveillance of contraceptive pills: Secondary | ICD-10-CM

## 2016-01-01 DIAGNOSIS — Z124 Encounter for screening for malignant neoplasm of cervix: Secondary | ICD-10-CM | POA: Diagnosis not present

## 2016-01-01 DIAGNOSIS — I10 Essential (primary) hypertension: Secondary | ICD-10-CM | POA: Diagnosis not present

## 2016-01-01 LAB — HEMOCCULT GUIAC POC 1CARD (OFFICE): Fecal Occult Blood, POC: NEGATIVE

## 2016-01-01 MED ORDER — NORETHINDRONE-ETH ESTRADIOL 0.4-35 MG-MCG PO TABS: ORAL_TABLET | ORAL | 4 refills | Status: DC

## 2016-01-01 MED ORDER — LISINOPRIL 10 MG PO TABS: 5.0000 mg | ORAL_TABLET | Freq: Every day | ORAL | 6 refills | Status: DC

## 2016-01-01 NOTE — Patient Instructions (Addendum)
Get mammogram  Physical in 1 year, pap in 3 if normal Follow up with PCP

## 2016-01-01 NOTE — Progress Notes (Signed)
Patient ID: Natalie Hernandez, female   DOB: 1970-10-04, 45 y.o.   MRN: [id number] History of Present Illness: Natalie Hernandez is a 45 year old white female, married in for a well woman gyn exam and pap. PCP is Yisroel Ramming ,[place].   Current Medications, Allergies, Past Medical History, Past Surgical History, Family History and Social History were reviewed in [employer] record.     Review of Systems:  Patient denies any headaches, hearing loss, fatigue, blurred vision, shortness of breath, chest pain, abdominal pain, problems with bowel movements, urination, or intercourse. No joint pain or mood swings. She has gained some weight, and she is seeing a therapist now, still dealing with death of her brother.  Physical Exam:BP 122/68 (BP Location: Left Arm, Patient Position: Sitting, Cuff Size: Large)   Pulse (!) 58   Ht 5' 11.5" (1.816 m)   Wt (!) 343 lb 3.2 oz (155.7 kg)   BMI 47.20 kg/m  General:  Well developed, well nourished, no acute distress Skin:  Warm and dry Neck:  Midline trachea, normal thyroid, good ROM, no lymphadenopathy Lungs; Clear to auscultation bilaterally Breast:  No dominant palpable mass, retraction, or nipple discharge Cardiovascular: Regular rate and rhythm Abdomen:  Soft, non tender, no hepatosplenomegaly Pelvic:  External genitalia is normal in appearance, no lesions.  The vagina is normal in appearance. Urethra has no lesions or masses. The cervix is bulbous. Pap with HPV performed. Uterus is felt to be normal size, shape, and contour.  No adnexal masses or tenderness noted.Bladder is non tender, no masses felt. Rectal: Good sphincter tone, no polyps, or hemorrhoids felt.  Hemoccult negative. Extremities/musculoskeletal:  No swelling or varicosities noted, no clubbing or cyanosis Psych:  No mood changes, alert and cooperative,seems happy PHQ 2 score 0. Will get labs today.  Impression: 1. Encounter for gynecological examination with Papanicolaou smear  of cervix   2. Encounter for surveillance of contraceptive pills   3. Essential hypertension   4. Hypothyroidism, unspecified hypothyroidism type   5.       Diabetes  6.       Obesity    Plan: Check CBC,CMP,TSH and lipids,A1c and vitamin D Refilled balziva x 1 year, take 1 daily Refilled lisinopril 10 mg take 1/2 tab#30 with 6 refills She got flu shot at work this week She has had eye exam in March and got new glasses Physical in 1 year, pap in 3 if norma Get mammogram  Follow up with PCP

## 2016-01-02 LAB — COMPREHENSIVE METABOLIC PANEL
ALBUMIN: 4 g/dL (ref 3.5–5.5)
ALT: 11 IU/L (ref 0–32)
AST: 10 IU/L (ref 0–40)
Albumin/Globulin Ratio: 1.7 (ref 1.2–2.2)
Alkaline Phosphatase: 47 IU/L (ref 39–117)
BUN/Creatinine Ratio: 14 (ref 9–23)
BUN: 12 mg/dL (ref 6–24)
Bilirubin Total: 0.4 mg/dL (ref 0.0–1.2)
CALCIUM: 9.5 mg/dL (ref 8.7–10.2)
CO2: 23 mmol/L (ref 18–29)
CREATININE: 0.88 mg/dL (ref 0.57–1.00)
Chloride: 101 mmol/L (ref 96–106)
GFR calc Af Amer: 92 mL/min/{1.73_m2} (ref >59)
GFR, EST NON AFRICAN AMERICAN: 80 mL/min/{1.73_m2} (ref >59)
GLOBULIN, TOTAL: 2.4 g/dL (ref 1.5–4.5)
Glucose: 99 mg/dL (ref 65–99)
Potassium: 4.9 mmol/L (ref 3.5–5.2)
SODIUM: 139 mmol/L (ref 134–144)
Total Protein: 6.4 g/dL (ref 6.0–8.5)

## 2016-01-02 LAB — LIPID PANEL
CHOL/HDL RATIO: 2.9 ratio (ref 0.0–4.4)
Cholesterol, Total: 223 mg/dL — ABNORMAL HIGH (ref 100–199)
HDL: 78 mg/dL (ref >39)
LDL Calculated: 115 mg/dL — ABNORMAL HIGH (ref 0–99)
Triglycerides: 152 mg/dL — ABNORMAL HIGH (ref 0–149)
VLDL Cholesterol Cal: 30 mg/dL (ref 5–40)

## 2016-01-02 LAB — HEMOGLOBIN A1C
ESTIMATED AVERAGE GLUCOSE: 114 mg/dL
HEMOGLOBIN A1C: 5.6 % (ref 4.8–5.6)

## 2016-01-02 LAB — VITAMIN D 25 HYDROXY (VIT D DEFICIENCY, FRACTURES): VIT D 25 HYDROXY: 24.9 ng/mL — AB (ref 30.0–100.0)

## 2016-01-02 LAB — CBC
HEMATOCRIT: 42.1 % (ref 34.0–46.6)
HEMOGLOBIN: 14.1 g/dL (ref 11.1–15.9)
MCH: 29.7 pg (ref 26.6–33.0)
MCHC: 33.5 g/dL (ref 31.5–35.7)
MCV: 89 fL (ref 79–97)
Platelets: 253 10*3/uL (ref 150–379)
RBC: 4.75 x10E6/uL (ref 3.77–5.28)
RDW: 13.4 % (ref 12.3–15.4)
WBC: 6.7 10*3/uL (ref 3.4–10.8)

## 2016-01-02 LAB — TSH: TSH: 3.48 u[IU]/mL (ref 0.450–4.500)

## 2016-01-04 LAB — CYTOLOGY - PAP

## 2016-01-05 ENCOUNTER — Encounter: Payer: Self-pay | Admitting: Adult Health

## 2016-01-05 ENCOUNTER — Telehealth: Payer: Self-pay | Admitting: Adult Health

## 2016-01-05 DIAGNOSIS — E559 Vitamin D deficiency, unspecified: Secondary | ICD-10-CM

## 2016-01-05 HISTORY — DX: Vitamin D deficiency, unspecified: E55.9

## 2016-01-05 MED ORDER — CHOLECALCIFEROL 125 MCG (5000 UT) PO CAPS: 5000.0000 [IU] | ORAL_CAPSULE | Freq: Every day | ORAL | Status: DC

## 2016-01-05 MED ORDER — METFORMIN HCL 500 MG PO TABS: ORAL_TABLET | ORAL | 6 refills | Status: DC

## 2016-01-05 MED ORDER — SYNTHROID 125 MCG PO TABS: ORAL_TABLET | ORAL | 6 refills | Status: DC

## 2016-01-05 NOTE — Telephone Encounter (Signed)
Pt aware of labs, take vitamin D 5000 IU Daily and continue with other meds at current dose,

## 2016-01-28 DIAGNOSIS — R635 Abnormal weight gain: Secondary | ICD-10-CM | POA: Diagnosis not present

## 2016-01-28 DIAGNOSIS — E119 Type 2 diabetes mellitus without complications: Secondary | ICD-10-CM | POA: Diagnosis not present

## 2016-01-28 DIAGNOSIS — E785 Hyperlipidemia, unspecified: Secondary | ICD-10-CM | POA: Diagnosis not present

## 2016-02-21 DIAGNOSIS — M79671 Pain in right foot: Secondary | ICD-10-CM | POA: Diagnosis not present

## 2016-02-22 ENCOUNTER — Ambulatory Visit (INDEPENDENT_AMBULATORY_CARE_PROVIDER_SITE_OTHER): Payer: BLUE CROSS/BLUE SHIELD

## 2016-02-22 ENCOUNTER — Ambulatory Visit (INDEPENDENT_AMBULATORY_CARE_PROVIDER_SITE_OTHER): Payer: BLUE CROSS/BLUE SHIELD | Admitting: Podiatry

## 2016-02-22 ENCOUNTER — Encounter: Payer: Self-pay | Admitting: Podiatry

## 2016-02-22 VITALS — BP 116/67 | HR 64

## 2016-02-22 DIAGNOSIS — M7751 Other enthesopathy of right foot: Secondary | ICD-10-CM | POA: Diagnosis not present

## 2016-02-22 DIAGNOSIS — M10071 Idiopathic gout, right ankle and foot: Secondary | ICD-10-CM

## 2016-02-22 DIAGNOSIS — M79671 Pain in right foot: Secondary | ICD-10-CM | POA: Diagnosis not present

## 2016-02-22 MED ORDER — BETAMETHASONE SOD PHOS & ACET 6 (3-3) MG/ML IJ SUSP: 3.0000 mg | Freq: Once | INTRAMUSCULAR | Status: DC

## 2016-02-22 MED ORDER — INDOMETHACIN 50 MG PO CAPS: 50.0000 mg | ORAL_CAPSULE | Freq: Three times a day (TID) | ORAL | 0 refills | Status: DC

## 2016-02-22 NOTE — Patient Instructions (Signed)

## 2016-02-22 NOTE — Progress Notes (Signed)
Subjective:  Patient presents today for right foot pain. Patient states that on Friday morning, 02/19/2016, she woke up with pain and tenderness to her right forefoot. Patient states she does have a history of gout. Last gout attack was partially 4 years ago.    Objective/Physical Exam General: The patient is alert and oriented x3 in no acute distress.  Dermatology: Skin is warm, dry and supple bilateral lower extremities. Negative for open lesions or macerations.  Vascular: Palpable pedal pulses bilaterally. No edema or erythema noted. Capillary refill within normal limits.  Neurological: Epicritic and protective threshold grossly intact bilaterally.   Musculoskeletal Exam: Pain on palpation and range of motion to the second and third MPJ of the right foot. There is localized edema with erythema noted.  Radiographic Exam:  Normal osseous mineralization. Joint spaces preserved. No fracture/dislocation/boney destruction.    Assessment: #1 acute gout attack second MPJ right foot #2 edema and erythema localized around the second MPJ right foot #3 pain in right foot   Plan of Care:  #1 Patient was evaluated. #2 injection of 0.5 mL Celestone Soluspan injected in the second MPJ right foot #3 prescription for indomethacin 50 mg 15 3 times a day #4 today a postoperative shoe was dispensed #5 return to clinic in 2 weeks   Dr. Edrick Kins, [place]

## 2016-02-25 DIAGNOSIS — R635 Abnormal weight gain: Secondary | ICD-10-CM | POA: Diagnosis not present

## 2016-02-25 DIAGNOSIS — Z6841 Body Mass Index (BMI) 40.0 and over, adult: Secondary | ICD-10-CM | POA: Diagnosis not present

## 2016-02-25 DIAGNOSIS — E785 Hyperlipidemia, unspecified: Secondary | ICD-10-CM | POA: Diagnosis not present

## 2016-03-09 ENCOUNTER — Ambulatory Visit: Payer: BLUE CROSS/BLUE SHIELD | Admitting: Podiatry

## 2016-03-12 ENCOUNTER — Other Ambulatory Visit: Payer: Self-pay | Admitting: Adult Health

## 2016-03-23 ENCOUNTER — Ambulatory Visit (INDEPENDENT_AMBULATORY_CARE_PROVIDER_SITE_OTHER): Payer: BLUE CROSS/BLUE SHIELD | Admitting: Podiatry

## 2016-03-23 DIAGNOSIS — M7751 Other enthesopathy of right foot: Secondary | ICD-10-CM

## 2016-03-23 DIAGNOSIS — M79671 Pain in right foot: Secondary | ICD-10-CM | POA: Diagnosis not present

## 2016-03-23 DIAGNOSIS — M10071 Idiopathic gout, right ankle and foot: Secondary | ICD-10-CM | POA: Diagnosis not present

## 2016-03-23 LAB — URIC ACID: Uric Acid, Serum: 7.2 mg/dL — ABNORMAL HIGH (ref 2.5–7.0)

## 2016-03-23 MED ORDER — MELOXICAM 15 MG PO TABS: 15.0000 mg | ORAL_TABLET | Freq: Every day | ORAL | 1 refills | Status: AC

## 2016-03-23 MED ORDER — BETAMETHASONE SOD PHOS & ACET 6 (3-3) MG/ML IJ SUSP: 3.0000 mg | Freq: Once | INTRAMUSCULAR | Status: DC

## 2016-03-23 NOTE — Progress Notes (Signed)
Subjective:  Patient presents today for right foot pain. Patient states that on Friday morning, 02/19/2016, she woke up with pain and tenderness to her right forefoot. Patient states she does have a history of gout. Last gout attack was partially 4 years ago.    Objective/Physical Exam General: The patient is alert and oriented x3 in no acute distress.  Dermatology: Skin is warm, dry and supple bilateral lower extremities. Negative for open lesions or macerations.  Vascular: Palpable pedal pulses bilaterally. No edema or erythema noted. Capillary refill within normal limits.  Neurological: Epicritic and protective threshold grossly intact bilaterally.   Musculoskeletal Exam: Pain on palpation and range of motion to the second and third MPJ of the right foot. There is localized edema with erythema noted.  Radiographic Exam:  Normal osseous mineralization. Joint spaces preserved. No fracture/dislocation/boney destruction.    Assessment: #1 acute gout attack second MPJ right foot #2 edema and erythema localized around the second MPJ right foot #3 pain in right foot   Plan of Care:  #1 Patient was evaluated. #2 injection of 0.5 mL Celestone Soluspan injected in the second MPJ right foot #3 prescription for meloxicam 15 mg #4 prescription for anti-inflammatory pain cream through [place] #5 orders for uric acid  #6 return to clinic in 4 weeks   Dr. Edrick Kins, [place]

## 2016-03-25 ENCOUNTER — Telehealth: Payer: Self-pay | Admitting: *Deleted

## 2016-03-25 DIAGNOSIS — T148XXA Other injury of unspecified body region, initial encounter: Secondary | ICD-10-CM | POA: Diagnosis not present

## 2016-03-25 DIAGNOSIS — E559 Vitamin D deficiency, unspecified: Secondary | ICD-10-CM | POA: Diagnosis not present

## 2016-03-25 MED ORDER — NONFORMULARY OR COMPOUNDED ITEM: 1.0000 g | Freq: Four times a day (QID) | 2 refills | Status: DC

## 2016-03-25 NOTE — Telephone Encounter (Signed)
Pt states she has questions about yesterday's visit. Pt's employer is closed. I spoke with pt and she asked if the injection yesterday had a cortisone in it and it told her yes.

## 2016-03-25 NOTE — Addendum Note (Signed)
Addended by: Johnnye Lana A on: 03/25/2016 05:03 PM   Modules accepted: Orders

## 2016-04-15 DIAGNOSIS — T148XXD Other injury of unspecified body region, subsequent encounter: Secondary | ICD-10-CM | POA: Diagnosis not present

## 2016-04-20 ENCOUNTER — Ambulatory Visit: Payer: BLUE CROSS/BLUE SHIELD | Admitting: Podiatry

## 2016-05-24 DIAGNOSIS — R002 Palpitations: Secondary | ICD-10-CM | POA: Diagnosis not present

## 2016-05-24 DIAGNOSIS — R0789 Other chest pain: Secondary | ICD-10-CM | POA: Diagnosis not present

## 2016-05-24 DIAGNOSIS — E119 Type 2 diabetes mellitus without complications: Secondary | ICD-10-CM | POA: Diagnosis not present

## 2016-05-24 DIAGNOSIS — F419 Anxiety disorder, unspecified: Secondary | ICD-10-CM | POA: Diagnosis not present

## 2016-05-27 ENCOUNTER — Telehealth: Payer: Self-pay | Admitting: Cardiovascular Disease

## 2016-05-27 NOTE — Telephone Encounter (Signed)
05/27/2016 Received faxed referral packet from Dr. Shanon Rosser, [place] for upcoming appointment with Dr. Oval Linsey on 06/08/2016.  Records given to [facility]. cbr

## 2016-06-01 ENCOUNTER — Ambulatory Visit (INDEPENDENT_AMBULATORY_CARE_PROVIDER_SITE_OTHER): Payer: BLUE CROSS/BLUE SHIELD | Admitting: Cardiovascular Disease

## 2016-06-01 ENCOUNTER — Encounter: Payer: Self-pay | Admitting: Cardiovascular Disease

## 2016-06-01 VITALS — BP 136/80 | HR 67 | Ht 72.0 in | Wt 374.0 lb

## 2016-06-01 DIAGNOSIS — R002 Palpitations: Secondary | ICD-10-CM | POA: Insufficient documentation

## 2016-06-01 DIAGNOSIS — I10 Essential (primary) hypertension: Secondary | ICD-10-CM

## 2016-06-01 NOTE — Progress Notes (Signed)
06/01/2016 Natalie Hernandez   Aug 10, 1970  [id number]  Primary Physician No PCP Per Patient Primary Cardiologist: Lorretta Harp MD Renae Gloss  HPI:  Natalie Hernandez is a 46 year old morbidly overweight married Caucasian female mother of one child referred by [facility] urgent care for evaluation of new onset tachypalpitations. Risk factors for heart disease include treated hypertension and non-trocar diabetes. She has no family history. She was evaluated by Dr. Debara Pickett 5 or 6 years ago for similar symptoms and had a normal 2-D echo and nonischemic Myoview. She unfortunately lost both of her brothers one to gunshot wound with his anniversary being in several weeks. She's had 2 weeks of tachycardia palpitations occurring every other day which "takes her breath away". The symptoms improved after taking Xanax.   Current Outpatient Prescriptions  Medication Sig Dispense Refill  . ALPRAZolam (XANAX) 0.25 MG tablet Take 1 tablet (0.25 mg total) by mouth 2 (two) times daily as needed for anxiety. (Patient taking differently: Take by mouth. Takes half a tab prn) 60 tablet 0  . BALZIVA 0.4-35 MG-MCG tablet TAKE ONE TABLET BY MOUTH ONCE DAILY CONTINUOUSLY 112 tablet 4  . Cholecalciferol 5000 units capsule Take 1 capsule (5,000 Units total) by mouth daily.    . Colchicine (COLCRYS PO) Take by mouth as needed.     . indomethacin (INDOCIN) 50 MG capsule Take 1 capsule (50 mg total) by mouth 3 (three) times daily with meals. 15 capsule 0  . lisinopril (PRINIVIL,ZESTRIL) 10 MG tablet Take 0.5 tablets (5 mg total) by mouth daily. 30 tablet 6  . metFORMIN (GLUCOPHAGE) 500 MG tablet TAKE ONE TABLET BY MOUTH ONCE DAILY WITH BREAKFAST 30 tablet 6  . norethindrone-ethinyl estradiol (OVCON-35,BALZIVA,BRIELLYN) 0.4-35 MG-MCG tablet Take 1 tablet by mouth daily.    Marland Kitchen SYNTHROID 125 MCG tablet TAKE ONE TABLET BY MOUTH ONCE DAILY BEFORE  BREAKFAST 60 tablet 6   No current facility-administered  medications for this visit.     Allergies  Allergen Reactions  . Celexa [Citalopram Hydrobromide] Rash    Social History   Social History  . Marital status: Married    Spouse name: N/A  . Number of children: N/A  . Years of education: N/A   Occupational History  . Not on file.   Social History Main Topics  . Smoking status: Never Smoker  . Smokeless tobacco: Never Used  . Alcohol use Yes     Comment: occassional  . Drug use: No  . Sexual activity: Yes    Birth control/ protection: Pill   Other Topics Concern  . Not on file   Social History Narrative  . No narrative on file     Review of Systems: General: negative for chills, fever, night sweats or weight changes.  Cardiovascular: negative for chest pain, dyspnea on exertion, edema, orthopnea, palpitations, paroxysmal nocturnal dyspnea or shortness of breath Dermatological: negative for rash Respiratory: negative for cough or wheezing Urologic: negative for hematuria Abdominal: negative for nausea, vomiting, diarrhea, bright red blood per rectum, melena, or hematemesis Neurologic: negative for visual changes, syncope, or dizziness All other systems reviewed and are otherwise negative except as noted above.    Blood pressure 136/80, pulse 67, height 6' (1.829 m), weight (!) 374 lb (169.6 kg).  General appearance: alert and no distress Neck: no adenopathy, no carotid bruit, no JVD, supple, symmetrical, trachea midline and thyroid not enlarged, symmetric, no tenderness/mass/nodules Lungs: clear to auscultation bilaterally Heart: regular rate and rhythm, S1, S2  normal, no murmur, click, rub or gallop Extremities: extremities normal, atraumatic, no cyanosis or edema  EKG sinus rhythm at 67 without ST or T-wave changes. I personally reviewed this EKG.  ASSESSMENT AND PLAN:   Palpitations Natalie Hernandez was referred for evaluation of fairly new onset tachypalpitations. Risk factors for heart disease include treated  hypertension and non-insulin requiring diabetes. She unfortunately lost her to only brothers one of his death occurred in early 07-23-22. She does admit to increased anxiety. These episodes began 2 weeks ago and occur every other day. They are associated with some transient shortness of breath and atypical left shoulder pain. She was evaluated 5 or 6 years ago for similar episodes and had an normal 2-D echo and a low risk Myoview. I do not think these are ischemically mediated. They did not sound like PAF. I suspect are related to anxiety. She says the episodes improve after taking Xanax. I will see her back in 3 months for further evaluation. She continues to have these episodes will come evaluate disease with an event monitor and 2-D echocardiogram.  Hypertension History of hypertension blood pressure today 136/80. She is on lisinopril      Lorretta Harp MD [facility], [facility] 06/01/2016 12:08 PM

## 2016-06-01 NOTE — Assessment & Plan Note (Signed)
History of hypertension blood pressure today 136/80. She is on lisinopril

## 2016-06-01 NOTE — Patient Instructions (Signed)
Medication Instructions: Your physician recommends that you continue on your current medications as directed. Please refer to the Current Medication list given to you today.   Follow-Up: Your physician recommends that you schedule a follow-up appointment in: 3 months with Dr. Berry.  If you need a refill on your cardiac medications before your next appointment, please call your pharmacy.  

## 2016-06-01 NOTE — Assessment & Plan Note (Signed)
Ms. Natalie Hernandez was referred for evaluation of fairly new onset tachypalpitations. Risk factors for heart disease include treated hypertension and non-insulin requiring diabetes. She unfortunately lost her to only brothers one of his death occurred in early Jul 17, 2022. She does admit to increased anxiety. These episodes began 2 weeks ago and occur every other day. They are associated with some transient shortness of breath and atypical left shoulder pain. She was evaluated 5 or 6 years ago for similar episodes and had an normal 2-D echo and a low risk Myoview. I do not think these are ischemically mediated. They did not sound like PAF. I suspect are related to anxiety. She says the episodes improve after taking Xanax. I will see her back in 3 months for further evaluation. She continues to have these episodes will come evaluate disease with an event monitor and 2-D echocardiogram.

## 2016-06-08 ENCOUNTER — Ambulatory Visit: Payer: BLUE CROSS/BLUE SHIELD | Admitting: Cardiovascular Disease

## 2016-08-21 ENCOUNTER — Encounter: Payer: Self-pay | Admitting: Adult Health

## 2016-08-22 ENCOUNTER — Other Ambulatory Visit: Payer: Self-pay | Admitting: Adult Health

## 2016-08-22 MED ORDER — LISINOPRIL 10 MG PO TABS: 10.0000 mg | ORAL_TABLET | Freq: Every day | ORAL | 6 refills | Status: DC

## 2016-08-24 ENCOUNTER — Ambulatory Visit: Payer: BLUE CROSS/BLUE SHIELD | Admitting: Cardiovascular Disease

## 2016-08-26 ENCOUNTER — Ambulatory Visit: Payer: BLUE CROSS/BLUE SHIELD | Admitting: Cardiovascular Disease

## 2016-12-01 ENCOUNTER — Other Ambulatory Visit: Payer: Self-pay | Admitting: Adult Health

## 2017-01-06 ENCOUNTER — Ambulatory Visit (INDEPENDENT_AMBULATORY_CARE_PROVIDER_SITE_OTHER): Payer: BLUE CROSS/BLUE SHIELD | Admitting: Adult Health

## 2017-01-06 ENCOUNTER — Encounter: Payer: Self-pay | Admitting: Adult Health

## 2017-01-06 ENCOUNTER — Other Ambulatory Visit: Payer: Self-pay | Admitting: Adult Health

## 2017-01-06 VITALS — BP 124/70 | HR 59 | Ht 72.25 in | Wt 394.8 lb

## 2017-01-06 DIAGNOSIS — F419 Anxiety disorder, unspecified: Secondary | ICD-10-CM | POA: Diagnosis not present

## 2017-01-06 DIAGNOSIS — I10 Essential (primary) hypertension: Secondary | ICD-10-CM

## 2017-01-06 DIAGNOSIS — Z1212 Encounter for screening for malignant neoplasm of rectum: Secondary | ICD-10-CM | POA: Diagnosis not present

## 2017-01-06 DIAGNOSIS — F329 Major depressive disorder, single episode, unspecified: Secondary | ICD-10-CM

## 2017-01-06 DIAGNOSIS — Z6841 Body Mass Index (BMI) 40.0 and over, adult: Secondary | ICD-10-CM

## 2017-01-06 DIAGNOSIS — E039 Hypothyroidism, unspecified: Secondary | ICD-10-CM | POA: Diagnosis not present

## 2017-01-06 DIAGNOSIS — E559 Vitamin D deficiency, unspecified: Secondary | ICD-10-CM

## 2017-01-06 DIAGNOSIS — Z3041 Encounter for surveillance of contraceptive pills: Secondary | ICD-10-CM | POA: Diagnosis not present

## 2017-01-06 DIAGNOSIS — Z01419 Encounter for gynecological examination (general) (routine) without abnormal findings: Secondary | ICD-10-CM | POA: Diagnosis not present

## 2017-01-06 DIAGNOSIS — Z01411 Encounter for gynecological examination (general) (routine) with abnormal findings: Secondary | ICD-10-CM | POA: Diagnosis not present

## 2017-01-06 DIAGNOSIS — E119 Type 2 diabetes mellitus without complications: Secondary | ICD-10-CM | POA: Diagnosis not present

## 2017-01-06 DIAGNOSIS — Z1211 Encounter for screening for malignant neoplasm of colon: Secondary | ICD-10-CM

## 2017-01-06 DIAGNOSIS — F32A Depression, unspecified: Secondary | ICD-10-CM

## 2017-01-06 LAB — HEMOCCULT GUIAC POC 1CARD (OFFICE): FECAL OCCULT BLD: NEGATIVE

## 2017-01-06 NOTE — Patient Instructions (Addendum)
Physical in 1 year Pap in 2020 Mammogram now and yearly Labs in 6 months

## 2017-01-06 NOTE — Progress Notes (Signed)
Patient ID: Natalie Hernandez, female   DOB: 12-15-1970, 46 y.o.   MRN: [id number] History of Present Illness: Natalie Hernandez is a 46 year old white female, married, G1P1 in for well woman gyn exam,she had normal pap with negative HPV 01/01/16.Daughter in first year at [employer]. She is still working at [employer].  Sees Urgent Care at [facility].   Current Medications, Allergies, Past Medical History, Past Surgical History, Family History and Social History were reviewed in [employer] record.     Review of Systems: Patient denies any headaches, hearing loss, fatigue, blurred vision, shortness of breath, chest pain, abdominal pain, problems with bowel movements, urination, or intercourse. No joint pain or mood swings. Has gained weight but started weight watchers and has lost 8 lbs.  Had eye exam last year.    Physical Exam:BP 124/70 (BP Location: Left Arm, Patient Position: Sitting, Cuff Size: Large)   Pulse (!) 59   Ht 6' 0.25" (1.835 m)   Wt (!) 394 lb 12.8 oz (179.1 kg)   BMI 53.17 kg/m  General:  Well developed, well nourished, no acute distress Skin:  Warm and dry Neck:  Midline trachea, normal thyroid, good ROM, no lymphadenopathy Lungs; Clear to auscultation bilaterally Breast:  No dominant palpable mass, retraction, or nipple discharge Cardiovascular: Regular rate and rhythm Abdomen:  Soft, non tender, no hepatosplenomegaly Pelvic:  External genitalia is normal in appearance, no lesions.  The vagina is normal in appearance. Urethra has no lesions or masses. The cervix is bulbous.  Uterus is felt to be normal size, shape, and contour.  No adnexal masses or tenderness noted.Bladder is non tender, no masses felt. Rectal: Good sphincter tone, no polyps, or hemorrhoids felt.  Hemoccult negative. Extremities/musculoskeletal:  No swelling or varicosities noted, no clubbing or cyanosis Psych:  No mood changes, alert and cooperative,seems happy PHQ 2 score 0.    Impression: 1. Encounter for well woman exam with routine gynecological exam   2. Screening for colorectal cancer   3. Essential hypertension   4. Vitamin D deficiency   5. Anxiety and depression   6. Diabetes mellitus without complication ([place])   7. Encounter for surveillance of contraceptive pills   8. Class 3 severe obesity due to excess calories with serious comorbidity and body mass index (BMI) of 50.0 to 59.9 in adult ([place])   9. Hypothyroidism, unspecified type       Plan: Check CBC,CMP,TSH and lipids,A1c and vitamin D Physical in 1 year Pap in 2020 Mammogram now and yearly Labs in 6 months  Continue meds, has refills

## 2017-01-07 LAB — COMPREHENSIVE METABOLIC PANEL
ALK PHOS: 48 IU/L (ref 39–117)
ALT: 23 IU/L (ref 0–32)
AST: 22 IU/L (ref 0–40)
Albumin/Globulin Ratio: 1.7 (ref 1.2–2.2)
Albumin: 4 g/dL (ref 3.5–5.5)
BUN/Creatinine Ratio: 11 (ref 9–23)
BUN: 9 mg/dL (ref 6–24)
Bilirubin Total: 0.4 mg/dL (ref 0.0–1.2)
CO2: 23 mmol/L (ref 20–29)
CREATININE: 0.85 mg/dL (ref 0.57–1.00)
Calcium: 9.3 mg/dL (ref 8.7–10.2)
Chloride: 102 mmol/L (ref 96–106)
GFR calc Af Amer: 96 mL/min/{1.73_m2} (ref >59)
GFR calc non Af Amer: 83 mL/min/{1.73_m2} (ref >59)
GLUCOSE: 106 mg/dL — AB (ref 65–99)
Globulin, Total: 2.3 g/dL (ref 1.5–4.5)
Potassium: 4.8 mmol/L (ref 3.5–5.2)
Sodium: 141 mmol/L (ref 134–144)
Total Protein: 6.3 g/dL (ref 6.0–8.5)

## 2017-01-07 LAB — LIPID PANEL
CHOLESTEROL TOTAL: 229 mg/dL — AB (ref 100–199)
Chol/HDL Ratio: 3.3 ratio (ref 0.0–4.4)
HDL: 70 mg/dL (ref >39)
LDL Calculated: 125 mg/dL — ABNORMAL HIGH (ref 0–99)
Triglycerides: 172 mg/dL — ABNORMAL HIGH (ref 0–149)
VLDL CHOLESTEROL CAL: 34 mg/dL (ref 5–40)

## 2017-01-07 LAB — CBC
HEMOGLOBIN: 13.5 g/dL (ref 11.1–15.9)
Hematocrit: 41.6 % (ref 34.0–46.6)
MCH: 29.5 pg (ref 26.6–33.0)
MCHC: 32.5 g/dL (ref 31.5–35.7)
MCV: 91 fL (ref 79–97)
PLATELETS: 241 10*3/uL (ref 150–379)
RBC: 4.58 x10E6/uL (ref 3.77–5.28)
RDW: 13.4 % (ref 12.3–15.4)
WBC: 7 10*3/uL (ref 3.4–10.8)

## 2017-01-07 LAB — HEMOGLOBIN A1C
ESTIMATED AVERAGE GLUCOSE: 128 mg/dL
Hgb A1c MFr Bld: 6.1 % — ABNORMAL HIGH (ref 4.8–5.6)

## 2017-01-07 LAB — TSH: TSH: 2.46 u[IU]/mL (ref 0.450–4.500)

## 2017-01-07 LAB — VITAMIN D 25 HYDROXY (VIT D DEFICIENCY, FRACTURES): Vit D, 25-Hydroxy: 17.3 ng/mL — ABNORMAL LOW (ref 30.0–100.0)

## 2017-01-12 ENCOUNTER — Encounter: Payer: Self-pay | Admitting: Adult Health

## 2017-03-14 ENCOUNTER — Other Ambulatory Visit: Payer: Self-pay | Admitting: Adult Health

## 2017-03-14 DIAGNOSIS — Z1231 Encounter for screening mammogram for malignant neoplasm of breast: Secondary | ICD-10-CM

## 2017-04-14 ENCOUNTER — Ambulatory Visit
Admission: RE | Admit: 2017-04-14 | Discharge: 2017-04-14 | Disposition: A | Discharge status: Home / Self Care | Payer: BLUE CROSS/BLUE SHIELD | Source: Ambulatory Visit | Attending: Adult Health | Admitting: Adult Health

## 2017-04-14 DIAGNOSIS — Z1231 Encounter for screening mammogram for malignant neoplasm of breast: Secondary | ICD-10-CM

## 2017-04-26 ENCOUNTER — Other Ambulatory Visit: Payer: Self-pay | Admitting: Adult Health

## 2017-05-14 ENCOUNTER — Other Ambulatory Visit: Payer: Self-pay | Admitting: Adult Health

## 2017-05-21 DIAGNOSIS — M10072 Idiopathic gout, left ankle and foot: Secondary | ICD-10-CM | POA: Diagnosis not present

## 2017-06-13 ENCOUNTER — Encounter: Payer: Self-pay | Admitting: Adult Health

## 2017-06-14 ENCOUNTER — Telehealth: Payer: Self-pay | Admitting: Adult Health

## 2017-06-14 DIAGNOSIS — Z8739 Personal history of other diseases of the musculoskeletal system and connective tissue: Secondary | ICD-10-CM

## 2017-06-14 DIAGNOSIS — E559 Vitamin D deficiency, unspecified: Secondary | ICD-10-CM

## 2017-06-14 DIAGNOSIS — E7800 Pure hypercholesterolemia, unspecified: Secondary | ICD-10-CM

## 2017-06-14 DIAGNOSIS — R7309 Other abnormal glucose: Secondary | ICD-10-CM

## 2017-06-14 DIAGNOSIS — E039 Hypothyroidism, unspecified: Secondary | ICD-10-CM

## 2017-06-14 NOTE — Telephone Encounter (Signed)
Pt wants labs will print order for pick on friday

## 2017-06-19 DIAGNOSIS — E039 Hypothyroidism, unspecified: Secondary | ICD-10-CM | POA: Diagnosis not present

## 2017-06-19 DIAGNOSIS — R7309 Other abnormal glucose: Secondary | ICD-10-CM | POA: Diagnosis not present

## 2017-06-19 DIAGNOSIS — E559 Vitamin D deficiency, unspecified: Secondary | ICD-10-CM | POA: Diagnosis not present

## 2017-06-19 DIAGNOSIS — E7800 Pure hypercholesterolemia, unspecified: Secondary | ICD-10-CM | POA: Diagnosis not present

## 2017-06-20 ENCOUNTER — Telehealth: Payer: Self-pay | Admitting: Adult Health

## 2017-06-20 LAB — VITAMIN D 25 HYDROXY (VIT D DEFICIENCY, FRACTURES): VIT D 25 HYDROXY: 14.8 ng/mL — AB (ref 30.0–100.0)

## 2017-06-20 LAB — COMPREHENSIVE METABOLIC PANEL
A/G RATIO: 1.5 (ref 1.2–2.2)
ALK PHOS: 49 IU/L (ref 39–117)
ALT: 16 IU/L (ref 0–32)
AST: 15 IU/L (ref 0–40)
Albumin: 3.8 g/dL (ref 3.5–5.5)
BUN/Creatinine Ratio: 8 — ABNORMAL LOW (ref 9–23)
BUN: 7 mg/dL (ref 6–24)
Bilirubin Total: 0.2 mg/dL (ref 0.0–1.2)
CHLORIDE: 105 mmol/L (ref 96–106)
CO2: 23 mmol/L (ref 20–29)
Calcium: 9.1 mg/dL (ref 8.7–10.2)
Creatinine, Ser: 0.85 mg/dL (ref 0.57–1.00)
GFR calc Af Amer: 96 mL/min/{1.73_m2} (ref >59)
GFR calc non Af Amer: 83 mL/min/{1.73_m2} (ref >59)
GLUCOSE: 111 mg/dL — AB (ref 65–99)
Globulin, Total: 2.6 g/dL (ref 1.5–4.5)
POTASSIUM: 4.3 mmol/L (ref 3.5–5.2)
Sodium: 143 mmol/L (ref 134–144)
Total Protein: 6.4 g/dL (ref 6.0–8.5)

## 2017-06-20 LAB — LIPID PANEL
Chol/HDL Ratio: 3.5 ratio (ref 0.0–4.4)
Cholesterol, Total: 240 mg/dL — ABNORMAL HIGH (ref 100–199)
HDL: 69 mg/dL (ref >39)
LDL Calculated: 124 mg/dL — ABNORMAL HIGH (ref 0–99)
TRIGLYCERIDES: 236 mg/dL — AB (ref 0–149)
VLDL Cholesterol Cal: 47 mg/dL — ABNORMAL HIGH (ref 5–40)

## 2017-06-20 LAB — TSH: TSH: 4.79 u[IU]/mL — AB (ref 0.450–4.500)

## 2017-06-20 LAB — HEMOGLOBIN A1C
Est. average glucose Bld gHb Est-mCnc: 137 mg/dL
HEMOGLOBIN A1C: 6.4 % — AB (ref 4.8–5.6)

## 2017-06-20 LAB — URIC ACID: URIC ACID: 7.4 mg/dL — AB (ref 2.5–7.1)

## 2017-06-20 MED ORDER — CHOLECALCIFEROL 125 MCG (5000 UT) PO CAPS: 5000.0000 [IU] | ORAL_CAPSULE | Freq: Every day | ORAL | Status: DC

## 2017-06-20 MED ORDER — SIMVASTATIN 20 MG PO TABS: 20.0000 mg | ORAL_TABLET | Freq: Every day | ORAL | 12 refills | Status: DC

## 2017-06-20 MED ORDER — METFORMIN HCL 500 MG PO TABS: 500.0000 mg | ORAL_TABLET | Freq: Two times a day (BID) | ORAL | 12 refills | Status: DC

## 2017-06-20 MED ORDER — LEVOTHYROXINE SODIUM 150 MCG PO TABS: 150.0000 ug | ORAL_TABLET | Freq: Every day | ORAL | 3 refills | Status: DC

## 2017-06-20 NOTE — Telephone Encounter (Signed)
Pt aware of labs, will increase synthroid to 150 mcg daily, take 500 mg metformin bid, take vitamin D 3 5000 IU daily, take Zocor 20 mg daily and will put in recall for labs in 3 months (CMP,TSH,A1c, lipids and vitamin D)

## 2017-06-23 ENCOUNTER — Other Ambulatory Visit: Payer: BLUE CROSS/BLUE SHIELD

## 2017-06-23 DIAGNOSIS — E119 Type 2 diabetes mellitus without complications: Secondary | ICD-10-CM | POA: Diagnosis not present

## 2017-06-23 DIAGNOSIS — E782 Mixed hyperlipidemia: Secondary | ICD-10-CM | POA: Diagnosis not present

## 2017-06-23 DIAGNOSIS — I10 Essential (primary) hypertension: Secondary | ICD-10-CM | POA: Diagnosis not present

## 2017-06-23 DIAGNOSIS — E039 Hypothyroidism, unspecified: Secondary | ICD-10-CM | POA: Diagnosis not present

## 2017-09-13 ENCOUNTER — Encounter: Payer: Self-pay | Admitting: Cardiology

## 2017-09-13 ENCOUNTER — Ambulatory Visit: Payer: BLUE CROSS/BLUE SHIELD | Admitting: Cardiology

## 2017-09-13 VITALS — BP 132/84 | HR 71 | Ht 72.0 in | Wt >= 6400 oz

## 2017-09-13 DIAGNOSIS — E119 Type 2 diabetes mellitus without complications: Secondary | ICD-10-CM | POA: Diagnosis not present

## 2017-09-13 DIAGNOSIS — F419 Anxiety disorder, unspecified: Secondary | ICD-10-CM

## 2017-09-13 DIAGNOSIS — E785 Hyperlipidemia, unspecified: Secondary | ICD-10-CM | POA: Diagnosis not present

## 2017-09-13 DIAGNOSIS — R002 Palpitations: Secondary | ICD-10-CM | POA: Diagnosis not present

## 2017-09-13 DIAGNOSIS — E039 Hypothyroidism, unspecified: Secondary | ICD-10-CM | POA: Diagnosis not present

## 2017-09-13 DIAGNOSIS — I10 Essential (primary) hypertension: Secondary | ICD-10-CM | POA: Diagnosis not present

## 2017-09-13 DIAGNOSIS — F329 Major depressive disorder, single episode, unspecified: Secondary | ICD-10-CM

## 2017-09-13 DIAGNOSIS — F32A Depression, unspecified: Secondary | ICD-10-CM

## 2017-09-13 DIAGNOSIS — G478 Other sleep disorders: Secondary | ICD-10-CM

## 2017-09-13 NOTE — Assessment & Plan Note (Signed)
BMI 55 

## 2017-09-13 NOTE — Assessment & Plan Note (Signed)
Pt c/o palpitations, intermittent sweating and hot flashes

## 2017-09-13 NOTE — Assessment & Plan Note (Signed)
Synthroid recently adjusted by PCP

## 2017-09-13 NOTE — Patient Instructions (Signed)
Medication Instructions: Your physician recommends that you continue on your current medications as directed. Please refer to the Current Medication list given to you today.  If you need a refill on your cardiac medications before your next appointment, please call your pharmacy.     Procedures/Testing: Your physician has requested that you have an echocardiogram. Echocardiography is a painless test that uses sound waves to create images of your heart. It provides your doctor with information about the size and shape of your heart and how well your heart's chambers and valves are working. This procedure takes approximately one hour. There are no restrictions for this procedure. [place] has recommended that you wear an event monitor for 7 days. Event monitors are medical devices that record the heart's electrical activity. Doctors most often [place] these monitors to diagnose arrhythmias. Arrhythmias are problems with the speed or rhythm of the heartbeat. The monitor is a small, portable device. You can wear one while you do your normal daily activities. This is usually used to diagnose what is causing palpitations/syncope (passing out). [place] has recommended that you have a sleep study. This test records several body functions during sleep, including: brain activity, eye movement, oxygen and carbon dioxide blood levels, heart rate and rhythm, breathing rate and rhythm, the flow of air through your mouth and nose, snoring, body muscle movements, and chest and belly movement. [facility]       Follow-Up: Your physician wants you to follow-up in  6 weeks with Dr. Gwenlyn Found  Special Instructions:    Thank you for choosing Heartcare at [facility]!!

## 2017-09-13 NOTE — Assessment & Plan Note (Signed)
Xanax PRN  

## 2017-09-13 NOTE — Assessment & Plan Note (Signed)
On oral agents 

## 2017-09-13 NOTE — Progress Notes (Signed)
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 She had seen Dr Debara Pickett several years ago (no records in [place]) and had an echo and Nuclear stress then that were reportedly WNL. She then saw Dr Gwenlyn Found with palpitations Feb 2018. This was in the setting of significant family stress. No test were ordered then- reassurance.  She presents now with complaints of intermittent Lt arm, shoulder blade pain, sweating and hot flashes at night, and palpitations. She is significantly obese but is afraid to exercise till she knows her heart is OK, hence this visit. Her arm sounds atypical, it can be reproduced with raising her Lt arm.  Current Outpatient Medications  Medication Sig Dispense Refill  . ALPRAZolam (XANAX) 0.25 MG tablet TAKE ONE TABLET BY MOUTH TWICE DAILY AS NEEDED FOR ANXIETY (Patient taking differently: TAKE ONE TABLET BY MOUTH  AS NEEDED FOR ANXIETY) 60 tablet 0  . BALZIVA 0.4-35 MG-MCG tablet TAKE ONE TABLET BY MOUTH ONCE DAILY CONTINUOUSLY 112 tablet 4  . Cholecalciferol 5000 units capsule Take 1 capsule (5,000 Units total) by mouth daily.    Marland Kitchen levothyroxine (SYNTHROID) 150 MCG tablet Take 1 tablet (150 mcg total) by mouth daily before breakfast. 30 tablet 3  . lisinopril (PRINIVIL,ZESTRIL) 10 MG tablet TAKE 1 TABLET BY MOUTH ONCE DAILY 30 tablet 6  . metFORMIN (GLUCOPHAGE) 500 MG tablet Take 1 tablet (500 mg total) by mouth 2 (two) times daily with a meal. 60 tablet 12  . simvastatin (ZOCOR) 20 MG tablet Take 1 tablet (20 mg total) by mouth daily. 30 tablet 12   No current facility-administered medications for this visit.     Allergies  Allergen Reactions  . Celexa [Citalopram Hydrobromide] Rash    Past Medical History:  Diagnosis Date  . Anxiety and depression 07/01/2015  . Contraceptive management 10/25/2013  .  Diabetes ([place]) 11/04/2013  . Dyslipidemia 10/25/2013  . Gout   . Headache(784.0)    menstral migraines  . Hyperlipidemia   . Hypertension   . Hypothyroidism   . Obesity   . Vitamin D deficiency 01/05/2016  . Vitamin D deficiency disease     Social History   Socioeconomic History  . Marital status: Married    Spouse name: Not on file  . Number of children: Not on file  . Years of education: Not on file  . Highest education level: Not on file  Occupational History  . Not on file  Social Needs  . Financial resource strain: Not on file  . Food insecurity:    Worry: Not on file    Inability: Not on file  . Transportation needs:    Medical: Not on file    Non-medical: Not on file  Tobacco Use  . Smoking status: Never Smoker  . Smokeless tobacco: Never Used  Substance and Sexual Activity  . Alcohol use: Yes    Comment: occassional  . Drug use: No  . Sexual activity: Yes    Birth control/protection: Pill  Lifestyle  . Physical activity:    Days per week: Not on file    Minutes per session: Not on file  . Stress: Not on file  Relationships  . Social connections:    Talks on phone: Not on file    Gets together: Not on file    Attends religious service: Not  on file    Active member of club or organization: Not on file    Attends meetings of clubs or organizations: Not on file    Relationship status: Not on file  . Intimate partner violence:    Fear of current or ex partner: Not on file    Emotionally abused: Not on file    Physically abused: Not on file    Forced sexual activity: Not on file  Other Topics Concern  . Not on file  Social History Narrative  . Not on file     Family History  Problem Relation Age of Onset  . Hypertension Father   . Diabetes Brother   . Cancer Maternal Aunt        vaginal  . Stroke Paternal Aunt   . Cancer Maternal Grandmother        colon  . CAD Paternal Grandfather   . Stroke Paternal Grandmother   . Cancer Maternal  Grandfather        colon  . Diabetes Mother   . Other Brother        was shot     Review of Systems: General: negative for chills, fever, night sweats or weight changes.  Cardiovascular: negative for chest pain, dyspnea on exertion, edema, orthopnea, palpitations, paroxysmal nocturnal dyspnea or shortness of breath Dermatological: negative for rash Respiratory: negative for cough or wheezing Urologic: negative for hematuria Abdominal: negative for nausea, vomiting, diarrhea, bright red blood per rectum, melena, or hematemesis Neurologic: negative for visual changes, syncope, or dizziness Poor sleep pattern Snoring Synthroid recently increased by PCP All other systems reviewed and are otherwise negative except as noted above.    Blood pressure 132/84, pulse 71, height 6' (1.829 m), weight (!) 410 lb 3.2 oz (186.1 kg).  General appearance: alert, cooperative, no distress and morbidly obese Neck: no carotid bruit and no JVD Lungs: clear to auscultation bilaterally Heart: regular rate and rhythm Extremities: extremities normal, atraumatic, no cyanosis or edema Skin: Skin color, texture, turgor normal. No rashes or lesions Neurologic: Grossly normal  EKG NSR  ASSESSMENT AND PLAN:   Palpitations Pt c/o palpitations, intermittent sweating and hot flashes  Poor sleep pattern Suspected sleep apnea- poor sleep, morbid obesity, snoring, palpitations  Non-insulin treated type 2 diabetes mellitus (HCC) On oral agents  Morbid obesity (HCC) BMI 55  Hypothyroidism Synthroid recently adjusted by PCP  Anxiety and depression Xanax PRN   PLAN  I suggested we get a 7 day Monitor, echo, and sleep study. Depending on these results she can be reassured her heart is OK to start exercise. She should f/u with Dr Gwenlyn Found after these test.   Kerin [place] PA-C 09/13/2017 9:25 AM

## 2017-09-13 NOTE — Assessment & Plan Note (Signed)
Suspected sleep apnea- poor sleep, morbid obesity, snoring, palpitations

## 2017-09-19 ENCOUNTER — Other Ambulatory Visit: Payer: Self-pay | Admitting: Cardiovascular Disease

## 2017-09-19 ENCOUNTER — Telehealth: Payer: Self-pay | Admitting: *Deleted

## 2017-09-19 DIAGNOSIS — G4733 Obstructive sleep apnea (adult) (pediatric): Secondary | ICD-10-CM

## 2017-09-19 NOTE — Telephone Encounter (Signed)
Patient notified of HST scheduled 09/26/17 @ [facility].

## 2017-09-20 ENCOUNTER — Ambulatory Visit (HOSPITAL_COMMUNITY): Payer: BLUE CROSS/BLUE SHIELD | Attending: Cardiology

## 2017-09-20 ENCOUNTER — Other Ambulatory Visit: Payer: Self-pay

## 2017-09-20 ENCOUNTER — Encounter (INDEPENDENT_AMBULATORY_CARE_PROVIDER_SITE_OTHER): Payer: BLUE CROSS/BLUE SHIELD

## 2017-09-20 DIAGNOSIS — R002 Palpitations: Secondary | ICD-10-CM

## 2017-09-20 DIAGNOSIS — E785 Hyperlipidemia, unspecified: Secondary | ICD-10-CM | POA: Diagnosis not present

## 2017-09-20 DIAGNOSIS — E119 Type 2 diabetes mellitus without complications: Secondary | ICD-10-CM | POA: Diagnosis not present

## 2017-09-20 DIAGNOSIS — Z6841 Body Mass Index (BMI) 40.0 and over, adult: Secondary | ICD-10-CM | POA: Insufficient documentation

## 2017-09-20 DIAGNOSIS — I10 Essential (primary) hypertension: Secondary | ICD-10-CM | POA: Insufficient documentation

## 2017-09-20 MED ORDER — PERFLUTREN LIPID MICROSPHERE
1.0000 mL | INTRAVENOUS | Status: AC | PRN
Start: 2017-09-20 — End: 2017-09-20
  Administered 2017-09-20: 3 mL via INTRAVENOUS

## 2017-09-25 ENCOUNTER — Telehealth: Payer: Self-pay | Admitting: *Deleted

## 2017-09-25 NOTE — Telephone Encounter (Signed)
LEFT MESSAGE TO CALL BACK WITH HUSBAND, ALSO CAN REVIEW INFO IN [place]

## 2017-09-25 NOTE — Telephone Encounter (Signed)
-----   Message from [place], [place] sent at 09/22/2017 10:47 AM EDT ----- Covering for [facility], normal pumping function, no significant valvular issue, mildly thickened heart wall. No significant structural heart issue to explain palpitation

## 2017-09-26 NOTE — Telephone Encounter (Signed)
Pt aware of echo results ./cy 

## 2017-09-26 NOTE — Telephone Encounter (Signed)
Follow Up:     Returning call from yesterday.concerning her Echo results.

## 2017-09-27 ENCOUNTER — Ambulatory Visit: Payer: BLUE CROSS/BLUE SHIELD | Attending: Cardiovascular Disease | Admitting: Cardiovascular Disease

## 2017-09-27 DIAGNOSIS — Z79899 Other long term (current) drug therapy: Secondary | ICD-10-CM | POA: Insufficient documentation

## 2017-09-27 DIAGNOSIS — Z79890 Hormone replacement therapy (postmenopausal): Secondary | ICD-10-CM | POA: Diagnosis not present

## 2017-09-27 DIAGNOSIS — Z7984 Long term (current) use of oral hypoglycemic drugs: Secondary | ICD-10-CM | POA: Diagnosis not present

## 2017-09-27 DIAGNOSIS — R002 Palpitations: Secondary | ICD-10-CM | POA: Diagnosis not present

## 2017-09-27 DIAGNOSIS — R0683 Snoring: Secondary | ICD-10-CM | POA: Diagnosis not present

## 2017-09-27 DIAGNOSIS — G4733 Obstructive sleep apnea (adult) (pediatric): Secondary | ICD-10-CM | POA: Diagnosis not present

## 2017-09-28 ENCOUNTER — Telehealth: Payer: Self-pay | Admitting: Cardiovascular Disease

## 2017-09-28 NOTE — Telephone Encounter (Signed)
New message:      Pt is returning a call for echo results

## 2017-09-28 NOTE — Telephone Encounter (Signed)
Pt aware of echo results  Awaiting monitor and sleep study results ./cy

## 2017-10-01 ENCOUNTER — Encounter: Payer: Self-pay | Admitting: Adult Health

## 2017-10-02 ENCOUNTER — Other Ambulatory Visit: Payer: Self-pay | Admitting: Adult Health

## 2017-10-02 DIAGNOSIS — E039 Hypothyroidism, unspecified: Secondary | ICD-10-CM

## 2017-10-02 DIAGNOSIS — R7309 Other abnormal glucose: Secondary | ICD-10-CM

## 2017-10-02 NOTE — Progress Notes (Signed)
Will check TSH and A1c

## 2017-10-03 ENCOUNTER — Other Ambulatory Visit: Payer: Self-pay | Admitting: *Deleted

## 2017-10-03 DIAGNOSIS — R7309 Other abnormal glucose: Secondary | ICD-10-CM | POA: Diagnosis not present

## 2017-10-03 DIAGNOSIS — E039 Hypothyroidism, unspecified: Secondary | ICD-10-CM | POA: Diagnosis not present

## 2017-10-03 MED ORDER — LEVOTHYROXINE SODIUM 150 MCG PO TABS: 150.0000 ug | ORAL_TABLET | Freq: Every day | ORAL | 3 refills | Status: DC

## 2017-10-04 ENCOUNTER — Telehealth: Payer: Self-pay | Admitting: Adult Health

## 2017-10-04 LAB — HEMOGLOBIN A1C
Est. average glucose Bld gHb Est-mCnc: 143 mg/dL
Hgb A1c MFr Bld: 6.6 % — ABNORMAL HIGH (ref 4.8–5.6)

## 2017-10-04 LAB — TSH: TSH: 6.66 u[IU]/mL — ABNORMAL HIGH (ref 0.450–4.500)

## 2017-10-04 MED ORDER — LEVOTHYROXINE SODIUM 175 MCG PO TABS: 175.0000 ug | ORAL_TABLET | Freq: Every day | ORAL | 2 refills | Status: DC

## 2017-10-04 MED ORDER — METFORMIN HCL 1000 MG PO TABS: 1000.0000 mg | ORAL_TABLET | Freq: Two times a day (BID) | ORAL | 2 refills | Status: DC

## 2017-10-04 NOTE — Telephone Encounter (Signed)
Pt aware of labs, and need to increase metformin to 1000 mg bid and increase synthroid to 175 mcg daily, decrease carbs and fats,take zocor and vitamin D better and recheck labs in 3 months place in recall (lipds,CMP,TSH and A1c and vitamin D), depending on results may send to endocrinologist then.She said she has not been good.

## 2017-10-07 ENCOUNTER — Encounter: Payer: Self-pay | Admitting: Cardiovascular Disease

## 2017-10-07 NOTE — Procedures (Signed)
   [place]   Patient Name: Natalie Hernandez, Natalie Hernandez Date: 09/27/2017 Gender: Female D.O.B: [birth year] Age (years): 46 Referring Provider: Shelva Majestic MD, ABSM Height (inches): 72 Interpreting Physician: Shelva Majestic MD, ABSM Weight (lbs): 410 RPSGT: Peak, Robert BMI: 56 MRN: [id number] Neck Size: <br>  CLINICAL INFORMATION Sleep Study Type: HST  Indication for sleep study: OSA  Epworth Sleepiness Score: 1  SLEEP STUDY TECHNIQUE A multi-channel overnight portable sleep study was performed. The channels recorded were: nasal airflow, thoracic respiratory movement, and oxygen saturation with a pulse oximetry. Snoring was also monitored.  MEDICATIONS     ALPRAZolam (XANAX) 0.25 MG tablet             BALZIVA 0.4-35 MG-MCG tablet         Cholecalciferol 5000 units capsule         levothyroxine (SYNTHROID, LEVOTHROID) 175 MCG tablet         lisinopril (PRINIVIL,ZESTRIL) 10 MG tablet         metFORMIN (GLUCOPHAGE) 1000 MG tablet         simvastatin (ZOCOR) 20 MG tablet      Patient self administered medications include: N/A.  SLEEP ARCHITECTURE Patient was studied for 423 minutes. The sleep efficiency was 87.6 % and the patient was supine for 53.2%. The arousal index was 0.0 per hour.  RESPIRATORY PARAMETERS The overall AHI was 9.4 per hour, with a central apnea index of 0.6 per hour.  The oxygen nadir was 83% during sleep.  CARDIAC DATA Mean heart rate during sleep was 69.8 bpm.  IMPRESSIONS - Mild obstructive sleep apnea occurred during this home study (AHI 9.4/h); however, events were worse with supine sleep with an AHI of 14.1/h. On his home study, the severity of sleep apnea cannot be assessed during REM sleep. - No significant central sleep apnea occurred during this study (CAI = 0.6/h). - Moderate oxygen desaturation to a nadir of 83%. - Patient snored 24.9% during the sleep.  DIAGNOSIS - Obstructive Sleep Apnea (327.23 [G47.33  ICD-10])  RECOMMENDATIONS - Recommend a therapeutic in- lab therapeutic CPAP titration to determine optimal pressure required to alleviate sleep disordered breathing. - Positional therapy avoiding supine position during sleep. - If patient is against CPAP therapy, initial alternatives to CPAP therapy, such as a customized oral appliance may be considered. - Efforts should be made to optimize nasal and oral pharyngeal patency. - Avoid alcohol, sedatives and other CNS depressants that may worsen sleep apnea and disrupt normal sleep architecture. - Sleep hygiene should be reviewed to assess factors that may improve sleep quality. - Weight management (BMI 56) and regular exercise should be initiated or continued.   [Electronically signed] 10/07/2017 03:31 PM  Shelva Majestic MD, facc, ABSM Diplomate, American Board of Sleep Medicine   NPI: [id number] [place] PH: [phone]   FX: [phone] [place]

## 2017-10-09 ENCOUNTER — Telehealth: Payer: Self-pay | Admitting: *Deleted

## 2017-10-09 ENCOUNTER — Other Ambulatory Visit: Payer: Self-pay | Admitting: Cardiovascular Disease

## 2017-10-09 DIAGNOSIS — G4733 Obstructive sleep apnea (adult) (pediatric): Secondary | ICD-10-CM

## 2017-10-09 NOTE — Telephone Encounter (Signed)
Patient notified of HST results and recommendations. She voiced verbal understanding.

## 2017-10-09 NOTE — Telephone Encounter (Signed)
-----   Message from [name], MD sent at 10/07/2017  3:35 PM EDT ----- Natalie Hernandez, please notify the patient the results of the sleep study.  Try to initiate an in lab CPAP titration trial.

## 2017-10-10 ENCOUNTER — Telehealth: Payer: Self-pay | Admitting: *Deleted

## 2017-10-10 NOTE — Telephone Encounter (Signed)
Patient notified [employer] denied in lab titration study. Approved APAP through [facility]. Orders sent to [facility].

## 2017-10-10 NOTE — Telephone Encounter (Signed)
-----   Message from Lauralee Evener, CMA sent at 10/09/2017  8:22 AM EDT ----- CPAP titration

## 2017-11-03 DIAGNOSIS — I10 Essential (primary) hypertension: Secondary | ICD-10-CM | POA: Diagnosis not present

## 2017-11-03 DIAGNOSIS — E119 Type 2 diabetes mellitus without complications: Secondary | ICD-10-CM | POA: Diagnosis not present

## 2017-11-03 DIAGNOSIS — Z0001 Encounter for general adult medical examination with abnormal findings: Secondary | ICD-10-CM | POA: Diagnosis not present

## 2017-11-08 ENCOUNTER — Ambulatory Visit: Payer: BLUE CROSS/BLUE SHIELD | Admitting: Cardiovascular Disease

## 2017-11-24 DIAGNOSIS — M7731 Calcaneal spur, right foot: Secondary | ICD-10-CM | POA: Diagnosis not present

## 2017-11-24 DIAGNOSIS — R252 Cramp and spasm: Secondary | ICD-10-CM | POA: Diagnosis not present

## 2017-11-24 DIAGNOSIS — Z6841 Body Mass Index (BMI) 40.0 and over, adult: Secondary | ICD-10-CM | POA: Diagnosis not present

## 2017-12-01 ENCOUNTER — Other Ambulatory Visit: Payer: Self-pay | Admitting: Adult Health

## 2017-12-22 DIAGNOSIS — M7731 Calcaneal spur, right foot: Secondary | ICD-10-CM | POA: Diagnosis not present

## 2017-12-22 DIAGNOSIS — R252 Cramp and spasm: Secondary | ICD-10-CM | POA: Diagnosis not present

## 2017-12-22 DIAGNOSIS — E039 Hypothyroidism, unspecified: Secondary | ICD-10-CM | POA: Diagnosis not present

## 2018-01-02 ENCOUNTER — Other Ambulatory Visit: Payer: Self-pay | Admitting: Adult Health

## 2018-01-02 DIAGNOSIS — E559 Vitamin D deficiency, unspecified: Secondary | ICD-10-CM

## 2018-01-02 DIAGNOSIS — E039 Hypothyroidism, unspecified: Secondary | ICD-10-CM

## 2018-01-02 DIAGNOSIS — E7800 Pure hypercholesterolemia, unspecified: Secondary | ICD-10-CM

## 2018-01-02 DIAGNOSIS — R7309 Other abnormal glucose: Secondary | ICD-10-CM

## 2018-01-02 NOTE — Progress Notes (Signed)
Labs ordered, refills done

## 2018-01-09 DIAGNOSIS — E7800 Pure hypercholesterolemia, unspecified: Secondary | ICD-10-CM | POA: Diagnosis not present

## 2018-01-09 DIAGNOSIS — R7309 Other abnormal glucose: Secondary | ICD-10-CM | POA: Diagnosis not present

## 2018-01-09 DIAGNOSIS — E039 Hypothyroidism, unspecified: Secondary | ICD-10-CM | POA: Diagnosis not present

## 2018-01-09 DIAGNOSIS — E559 Vitamin D deficiency, unspecified: Secondary | ICD-10-CM | POA: Diagnosis not present

## 2018-01-10 LAB — SPECIMEN STATUS

## 2018-01-10 LAB — HEMOGLOBIN A1C
ESTIMATED AVERAGE GLUCOSE: 137 mg/dL
Hgb A1c MFr Bld: 6.4 % — ABNORMAL HIGH (ref 4.8–5.6)

## 2018-01-10 LAB — VITAMIN D 25 HYDROXY (VIT D DEFICIENCY, FRACTURES): Vit D, 25-Hydroxy: 36.9 ng/mL (ref 30.0–100.0)

## 2018-01-10 LAB — TSH: TSH: 3.13 u[IU]/mL (ref 0.450–4.500)

## 2018-01-12 ENCOUNTER — Ambulatory Visit: Payer: BLUE CROSS/BLUE SHIELD | Admitting: Adult Health

## 2018-01-12 ENCOUNTER — Other Ambulatory Visit: Payer: Self-pay

## 2018-01-12 ENCOUNTER — Encounter: Payer: Self-pay | Admitting: Adult Health

## 2018-01-12 VITALS — BP 144/86 | HR 67 | Ht 72.0 in | Wt >= 6400 oz

## 2018-01-12 DIAGNOSIS — Z1212 Encounter for screening for malignant neoplasm of rectum: Secondary | ICD-10-CM | POA: Diagnosis not present

## 2018-01-12 DIAGNOSIS — I10 Essential (primary) hypertension: Secondary | ICD-10-CM

## 2018-01-12 DIAGNOSIS — Z01419 Encounter for gynecological examination (general) (routine) without abnormal findings: Secondary | ICD-10-CM | POA: Diagnosis not present

## 2018-01-12 DIAGNOSIS — E119 Type 2 diabetes mellitus without complications: Secondary | ICD-10-CM

## 2018-01-12 DIAGNOSIS — Z1211 Encounter for screening for malignant neoplasm of colon: Secondary | ICD-10-CM

## 2018-01-12 DIAGNOSIS — Z3041 Encounter for surveillance of contraceptive pills: Secondary | ICD-10-CM

## 2018-01-12 DIAGNOSIS — F32A Depression, unspecified: Secondary | ICD-10-CM

## 2018-01-12 DIAGNOSIS — R7309 Other abnormal glucose: Secondary | ICD-10-CM | POA: Insufficient documentation

## 2018-01-12 DIAGNOSIS — E039 Hypothyroidism, unspecified: Secondary | ICD-10-CM

## 2018-01-12 DIAGNOSIS — F419 Anxiety disorder, unspecified: Secondary | ICD-10-CM

## 2018-01-12 DIAGNOSIS — Z6841 Body Mass Index (BMI) 40.0 and over, adult: Secondary | ICD-10-CM

## 2018-01-12 DIAGNOSIS — F329 Major depressive disorder, single episode, unspecified: Secondary | ICD-10-CM

## 2018-01-12 LAB — HEMOCCULT GUIAC POC 1CARD (OFFICE): Fecal Occult Blood, POC: NEGATIVE

## 2018-01-12 LAB — COMPREHENSIVE METABOLIC PANEL
ALT: 15 IU/L (ref 0–32)
AST: 29 IU/L (ref 0–40)
Albumin/Globulin Ratio: 1.7 (ref 1.2–2.2)
Albumin: 3.8 g/dL (ref 3.5–5.5)
Alkaline Phosphatase: 49 IU/L (ref 39–117)
BUN/Creatinine Ratio: 13 (ref 9–23)
BUN: 11 mg/dL (ref 6–24)
Bilirubin Total: 0.2 mg/dL (ref 0.0–1.2)
CALCIUM: 9.6 mg/dL (ref 8.7–10.2)
CO2: 17 mmol/L — AB (ref 20–29)
CREATININE: 0.85 mg/dL (ref 0.57–1.00)
Chloride: 104 mmol/L (ref 96–106)
GFR, EST AFRICAN AMERICAN: 95 mL/min/{1.73_m2} (ref >59)
GFR, EST NON AFRICAN AMERICAN: 82 mL/min/{1.73_m2} (ref >59)
GLUCOSE: 110 mg/dL — AB (ref 65–99)
Globulin, Total: 2.2 g/dL (ref 1.5–4.5)
Potassium: 4.8 mmol/L (ref 3.5–5.2)
Sodium: 143 mmol/L (ref 134–144)
TOTAL PROTEIN: 6 g/dL (ref 6.0–8.5)

## 2018-01-12 LAB — SPECIMEN STATUS REPORT

## 2018-01-12 LAB — HGB A1C W/O EAG: Hgb A1c MFr Bld: 6.5 % — ABNORMAL HIGH (ref 4.8–5.6)

## 2018-01-12 LAB — VITAMIN D 25 HYDROXY (VIT D DEFICIENCY, FRACTURES): VIT D 25 HYDROXY: 34.5 ng/mL (ref 30.0–100.0)

## 2018-01-12 MED ORDER — LEVOTHYROXINE SODIUM 200 MCG PO TABS: 200.0000 ug | ORAL_TABLET | Freq: Every day | ORAL | 3 refills | Status: DC

## 2018-01-12 MED ORDER — NORETHINDRONE-ETH ESTRADIOL 0.4-35 MG-MCG PO TABS: ORAL_TABLET | ORAL | 4 refills | Status: DC

## 2018-01-12 MED ORDER — LISINOPRIL 10 MG PO TABS: 10.0000 mg | ORAL_TABLET | Freq: Every day | ORAL | 4 refills | Status: DC

## 2018-01-12 MED ORDER — SIMVASTATIN 20 MG PO TABS: 20.0000 mg | ORAL_TABLET | Freq: Every day | ORAL | 4 refills | Status: DC

## 2018-01-12 NOTE — Progress Notes (Signed)
Patient ID: Natalie Hernandez, female   DOB: Oct 14, 1970, 47 y.o.   MRN: [id number] History of Present Illness:  Natalie Hernandez is a 39 white female, married, G1P1 in for well woman gyn exam, she had a normal pap with negative HPV 01/01/16.She has episode of of sweating and chest felt funny and saw cardiologist and was fine.  PCP is Natalie Norlander NP.  Current Medications, Allergies, Past Medical History, Past Surgical History, Family History and Social History were reviewed in [employer] record.     Review of Systems:  Patient denies any headaches, hearing loss, fatigue, blurred vision, shortness of breath, chest pain, abdominal pain, problems with bowel movements, urination, or intercourse. No joint pain or mood swings.Has had a few hot flashes.   Physical Exam:BP (!) 144/86 (BP Location: Left Arm, Patient Position: Sitting, Cuff Size: Normal)   Pulse 67   Ht 6' (1.829 m)   Wt (!) 407 lb 3.2 oz (184.7 kg)   BMI 55.23 kg/m   General:  Well developed, well nourished, no acute distress Skin:  Warm and dry Neck:  Midline trachea, normal thyroid, good ROM, no lymphadenopathy Lungs; Clear to auscultation bilaterally Breast:  No dominant palpable mass, retraction, or nipple discharge Cardiovascular: Regular rate and rhythm Abdomen:  Soft, non tender, no hepatosplenomegaly Pelvic:  External genitalia is normal in appearance, no lesions.  The vagina is normal in appearance. Urethra has no lesions or masses. The cervix is bulbous.  Uterus is felt to be normal size, shape, and contour.  No adnexal masses or tenderness noted.Bladder is non tender, no masses felt. Rectal: Good sphincter tone, no polyps, or hemorrhoids felt.  Hemoccult negative. Extremities/musculoskeletal:  No swelling or varicosities noted, no clubbing or cyanosis Psych:  No mood changes, alert and cooperative,seems happy PHQ 2 score 0. Examination chaperoned by Natalie Pupa LPN. Review recent labs with her, will  increase synthroid to 200 mcg and recheck labs in 3 months, continue other meds as is.   Impression: 1. Encounter for well woman exam with routine gynecological exam   2. Screening for colorectal cancer   3. Encounter for surveillance of contraceptive pills   4. Essential hypertension   5. Anxiety and depression   6. Hypothyroidism, unspecified type   7. Diabetes mellitus without complication (HCC)   8. Class 3 severe obesity due to excess calories with serious comorbidity and body mass index (BMI) of 50.0 to 59.9 in adult [facility])       Plan: Meds ordered this encounter  Medications  . levothyroxine (SYNTHROID) 200 MCG tablet    Sig: Take 1 tablet (200 mcg total) by mouth daily before breakfast.    Dispense:  30 tablet    Refill:  3    Order Specific Question:   Supervising Provider    Answer:   Elonda Husky, LUTHER H [[id number]]  . simvastatin (ZOCOR) 20 MG tablet    Sig: Take 1 tablet (20 mg total) by mouth daily.    Dispense:  90 tablet    Refill:  4    Order Specific Question:   Supervising Provider    Answer:   Elonda Husky, LUTHER H [[id number]]  . lisinopril (PRINIVIL,ZESTRIL) 10 MG tablet    Sig: Take 1 tablet (10 mg total) by mouth daily.    Dispense:  90 tablet    Refill:  4    Please consider 90 day supplies to promote better adherence    Order Specific Question:   Supervising Provider    Answer:  EURE, LUTHER H [[id number]]  . norethindrone-ethinyl estradiol (BALZIVA) 0.4-35 MG-MCG tablet    Sig: TAKE ONE TABLET BY MOUTH ONCE DAILY CONTINUOUSLY    Dispense:  112 tablet    Refill:  4    Please consider 90 day supplies to promote better adherence    Order Specific Question:   Supervising Provider    Answer:   Florian Buff [[id number]]  Has refills on metformin Work on weight Pap and physical in 1 year Mammogram yearly Discussed cologuard, check insurance, vs colonoscopy  Get labs, A1c and TSH in 3 months.

## 2018-01-16 LAB — LIPID PANEL
CHOLESTEROL TOTAL: 188 mg/dL (ref 100–199)
Chol/HDL Ratio: 3 ratio (ref 0.0–4.4)
HDL: 62 mg/dL (ref >39)
LDL CALC: 88 mg/dL (ref 0–99)
TRIGLYCERIDES: 189 mg/dL — AB (ref 0–149)
VLDL CHOLESTEROL CAL: 38 mg/dL (ref 5–40)

## 2018-02-01 DIAGNOSIS — E662 Morbid (severe) obesity with alveolar hypoventilation: Secondary | ICD-10-CM | POA: Diagnosis not present

## 2018-02-01 DIAGNOSIS — N390 Urinary tract infection, site not specified: Secondary | ICD-10-CM | POA: Diagnosis not present

## 2018-02-01 DIAGNOSIS — Z6841 Body Mass Index (BMI) 40.0 and over, adult: Secondary | ICD-10-CM | POA: Diagnosis not present

## 2018-04-16 DIAGNOSIS — R05 Cough: Secondary | ICD-10-CM | POA: Diagnosis not present

## 2018-04-16 DIAGNOSIS — R0981 Nasal congestion: Secondary | ICD-10-CM | POA: Diagnosis not present

## 2018-05-09 ENCOUNTER — Other Ambulatory Visit: Payer: Self-pay | Admitting: Adult Health

## 2018-05-09 DIAGNOSIS — E039 Hypothyroidism, unspecified: Secondary | ICD-10-CM

## 2018-05-09 NOTE — Progress Notes (Signed)
Check TSH and free T4 

## 2018-05-12 DIAGNOSIS — E039 Hypothyroidism, unspecified: Secondary | ICD-10-CM | POA: Diagnosis not present

## 2018-05-13 LAB — T4, FREE: FREE T4: 1.58 ng/dL (ref 0.82–1.77)

## 2018-05-13 LAB — TSH: TSH: 3.01 u[IU]/mL (ref 0.450–4.500)

## 2018-05-22 ENCOUNTER — Other Ambulatory Visit: Payer: Self-pay | Admitting: Obstetrics & Gynecology

## 2018-05-22 ENCOUNTER — Telehealth: Payer: Self-pay | Admitting: Adult Health

## 2018-05-22 MED ORDER — LEVOTHYROXINE SODIUM 200 MCG PO TABS: 200.0000 ug | ORAL_TABLET | Freq: Every day | ORAL | 3 refills | Status: DC

## 2018-05-22 NOTE — Telephone Encounter (Signed)
Calling to get results from thyroid test done last week/ also took her last pill and needs refills/ uses [facility] on [address]

## 2018-05-22 NOTE — Telephone Encounter (Signed)
done

## 2018-05-22 NOTE — Telephone Encounter (Signed)
Pt requests thyroid lab results and asks if she is to continue taking her synthroid. DOB verified. Advised, per provider, that labs are normal and that she is on the correct dosage of medication. Pt states that she needs a refill. Advised that I would send her request to a provider and she could check with her pharmacy later today. Pt verbalized understanding.

## 2018-05-23 ENCOUNTER — Other Ambulatory Visit: Payer: Self-pay | Admitting: Adult Health

## 2018-05-23 MED ORDER — LEVOTHYROXINE SODIUM 200 MCG PO TABS: 200.0000 ug | ORAL_TABLET | Freq: Every day | ORAL | 6 refills | Status: DC

## 2018-05-23 NOTE — Progress Notes (Signed)
Refilled levothyroxine.

## 2018-05-25 DIAGNOSIS — R05 Cough: Secondary | ICD-10-CM | POA: Diagnosis not present

## 2018-05-25 DIAGNOSIS — F419 Anxiety disorder, unspecified: Secondary | ICD-10-CM | POA: Diagnosis not present

## 2018-05-25 DIAGNOSIS — I10 Essential (primary) hypertension: Secondary | ICD-10-CM | POA: Diagnosis not present

## 2018-06-13 DIAGNOSIS — R1084 Generalized abdominal pain: Secondary | ICD-10-CM | POA: Diagnosis not present

## 2018-06-14 DIAGNOSIS — E119 Type 2 diabetes mellitus without complications: Secondary | ICD-10-CM | POA: Diagnosis not present

## 2018-06-14 DIAGNOSIS — E039 Hypothyroidism, unspecified: Secondary | ICD-10-CM | POA: Diagnosis not present

## 2018-06-14 DIAGNOSIS — E559 Vitamin D deficiency, unspecified: Secondary | ICD-10-CM | POA: Diagnosis not present

## 2018-06-14 DIAGNOSIS — M62838 Other muscle spasm: Secondary | ICD-10-CM | POA: Diagnosis not present

## 2018-07-02 ENCOUNTER — Other Ambulatory Visit: Payer: Self-pay | Admitting: Adult Health

## 2018-07-02 MED ORDER — ALPRAZOLAM 0.25 MG PO TABS: 0.2500 mg | ORAL_TABLET | Freq: Two times a day (BID) | ORAL | 0 refills | Status: DC | PRN

## 2018-07-02 NOTE — Progress Notes (Signed)
Refilled xanax

## 2018-07-10 ENCOUNTER — Other Ambulatory Visit: Payer: Self-pay | Admitting: Adult Health

## 2018-07-13 DIAGNOSIS — E785 Hyperlipidemia, unspecified: Secondary | ICD-10-CM | POA: Diagnosis not present

## 2018-07-13 DIAGNOSIS — E1169 Type 2 diabetes mellitus with other specified complication: Secondary | ICD-10-CM | POA: Diagnosis not present

## 2018-07-13 DIAGNOSIS — R945 Abnormal results of liver function studies: Secondary | ICD-10-CM | POA: Diagnosis not present

## 2018-07-13 DIAGNOSIS — I10 Essential (primary) hypertension: Secondary | ICD-10-CM | POA: Diagnosis not present

## 2018-07-29 ENCOUNTER — Other Ambulatory Visit: Payer: Self-pay | Admitting: Adult Health

## 2018-07-31 DIAGNOSIS — I10 Essential (primary) hypertension: Secondary | ICD-10-CM | POA: Diagnosis not present

## 2018-08-24 DIAGNOSIS — E1169 Type 2 diabetes mellitus with other specified complication: Secondary | ICD-10-CM | POA: Diagnosis not present

## 2018-08-24 DIAGNOSIS — M1000 Idiopathic gout, unspecified site: Secondary | ICD-10-CM | POA: Diagnosis not present

## 2018-08-24 DIAGNOSIS — E785 Hyperlipidemia, unspecified: Secondary | ICD-10-CM | POA: Diagnosis not present

## 2018-08-24 DIAGNOSIS — R05 Cough: Secondary | ICD-10-CM | POA: Diagnosis not present

## 2018-11-08 DIAGNOSIS — I10 Essential (primary) hypertension: Secondary | ICD-10-CM | POA: Diagnosis not present

## 2018-12-21 DIAGNOSIS — M25572 Pain in left ankle and joints of left foot: Secondary | ICD-10-CM | POA: Diagnosis not present

## 2018-12-21 DIAGNOSIS — I10 Essential (primary) hypertension: Secondary | ICD-10-CM | POA: Diagnosis not present

## 2018-12-21 DIAGNOSIS — M1A00X0 Idiopathic chronic gout, unspecified site, without tophus (tophi): Secondary | ICD-10-CM | POA: Diagnosis not present

## 2018-12-21 DIAGNOSIS — M62838 Other muscle spasm: Secondary | ICD-10-CM | POA: Diagnosis not present

## 2019-01-08 ENCOUNTER — Other Ambulatory Visit: Payer: Self-pay | Admitting: Adult Health

## 2019-01-18 DIAGNOSIS — Z6841 Body Mass Index (BMI) 40.0 and over, adult: Secondary | ICD-10-CM | POA: Diagnosis not present

## 2019-01-18 DIAGNOSIS — I10 Essential (primary) hypertension: Secondary | ICD-10-CM | POA: Diagnosis not present

## 2019-03-18 ENCOUNTER — Other Ambulatory Visit: Payer: Self-pay | Admitting: Adult Health

## 2019-03-18 DIAGNOSIS — Z01419 Encounter for gynecological examination (general) (routine) without abnormal findings: Secondary | ICD-10-CM

## 2019-03-18 DIAGNOSIS — E119 Type 2 diabetes mellitus without complications: Secondary | ICD-10-CM

## 2019-03-18 DIAGNOSIS — E039 Hypothyroidism, unspecified: Secondary | ICD-10-CM

## 2019-03-18 DIAGNOSIS — E7800 Pure hypercholesterolemia, unspecified: Secondary | ICD-10-CM

## 2019-03-18 DIAGNOSIS — E559 Vitamin D deficiency, unspecified: Secondary | ICD-10-CM

## 2019-03-18 NOTE — Progress Notes (Signed)
Labs ordered.

## 2019-03-19 ENCOUNTER — Encounter: Payer: Self-pay | Admitting: *Deleted

## 2019-03-19 DIAGNOSIS — Z008 Encounter for other general examination: Secondary | ICD-10-CM | POA: Diagnosis not present

## 2019-03-20 DIAGNOSIS — Z20828 Contact with and (suspected) exposure to other viral communicable diseases: Secondary | ICD-10-CM | POA: Diagnosis not present

## 2019-03-25 ENCOUNTER — Other Ambulatory Visit: Payer: Self-pay

## 2019-03-25 ENCOUNTER — Other Ambulatory Visit (HOSPITAL_COMMUNITY)
Admission: RE | Admit: 2019-03-25 | Discharge: 2019-03-25 | Disposition: A | Discharge status: Home / Self Care | Payer: BC Managed Care – PPO | Source: Ambulatory Visit | Attending: Adult Health | Admitting: Adult Health

## 2019-03-25 ENCOUNTER — Encounter: Payer: Self-pay | Admitting: Adult Health

## 2019-03-25 ENCOUNTER — Ambulatory Visit (INDEPENDENT_AMBULATORY_CARE_PROVIDER_SITE_OTHER): Payer: BC Managed Care – PPO | Admitting: Adult Health

## 2019-03-25 VITALS — BP 137/82 | HR 62 | Ht 72.0 in | Wt >= 6400 oz

## 2019-03-25 DIAGNOSIS — Z01419 Encounter for gynecological examination (general) (routine) without abnormal findings: Secondary | ICD-10-CM

## 2019-03-25 DIAGNOSIS — Z1211 Encounter for screening for malignant neoplasm of colon: Secondary | ICD-10-CM | POA: Diagnosis not present

## 2019-03-25 DIAGNOSIS — Z1212 Encounter for screening for malignant neoplasm of rectum: Secondary | ICD-10-CM | POA: Diagnosis not present

## 2019-03-25 DIAGNOSIS — E039 Hypothyroidism, unspecified: Secondary | ICD-10-CM

## 2019-03-25 DIAGNOSIS — Z6841 Body Mass Index (BMI) 40.0 and over, adult: Secondary | ICD-10-CM

## 2019-03-25 DIAGNOSIS — E559 Vitamin D deficiency, unspecified: Secondary | ICD-10-CM

## 2019-03-25 DIAGNOSIS — Z3041 Encounter for surveillance of contraceptive pills: Secondary | ICD-10-CM

## 2019-03-25 LAB — HEMOCCULT GUIAC POC 1CARD (OFFICE): Fecal Occult Blood, POC: NEGATIVE

## 2019-03-25 MED ORDER — CHOLECALCIFEROL 125 MCG (5000 UT) PO CAPS: 5000.0000 [IU] | ORAL_CAPSULE | Freq: Every day | ORAL | Status: DC

## 2019-03-25 MED ORDER — LEVOTHYROXINE SODIUM 25 MCG PO TABS: ORAL_TABLET | ORAL | 3 refills | Status: DC

## 2019-03-25 MED ORDER — LEVOTHYROXINE SODIUM 200 MCG PO TABS: 200.0000 ug | ORAL_TABLET | Freq: Every day | ORAL | 6 refills | Status: DC

## 2019-03-25 MED ORDER — VYFEMLA 0.4-35 MG-MCG PO TABS: ORAL_TABLET | ORAL | 4 refills | Status: DC

## 2019-03-25 NOTE — Progress Notes (Signed)
Patient ID: Natalie Hernandez, female   DOB: 05-May-1970, 48 y.o.   MRN: [id number] History of Present Illness: Natalie Hernandez is a 48 year old white female, married, G1P1 in for a well woman gyn exam and pap, and she brought her labs with her. PCP is Dr Nevada Crane.   Current Medications, Allergies, Past Medical History, Past Surgical History, Family History and Social History were reviewed in [employer] record.     Review of Systems: Patient denies any headaches, hearing loss, fatigue, blurred vision, shortness of breath, chest pain, abdominal pain, problems with bowel movements, urination, or intercourse. No joint pain or mood swings.    Physical Exam:BP 137/82 (BP Location: Left Arm, Patient Position: Sitting, Cuff Size: Large)   Pulse 62   Ht 6' (1.829 m)   Wt (!) 419 lb 3.2 oz (190.1 kg)   BMI 56.85 kg/m  General:  Well developed, well nourished, no acute distress Skin:  Warm and dry Neck:  Midline trachea, normal thyroid, good ROM, no lymphadenopathy Lungs; Clear to auscultation bilaterally Breast:  No dominant palpable mass, retraction, or nipple discharge Cardiovascular: Regular rate and rhythm Abdomen:  Soft, non tender, no hepatosplenomegaly Pelvic:  External genitalia is normal in appearance, no lesions.  The vagina is normal in appearance. Urethra has no lesions or masses. The cervix is bulbous. Pap with high risk HPV 16/18 genotyping performed. Uterus is felt to be normal size, shape, and contour.  No adnexal masses or tenderness noted.Bladder is non tender, no masses felt. Rectal: Good sphincter tone, no polyps, or hemorrhoids felt.  Hemoccult negative. Extremities/musculoskeletal:  No swelling or varicosities noted, no clubbing or cyanosis Psych:  No mood changes, alert and cooperative,seems happy Fall risk is low PHQ 2 score is 0. Examination chaperoned by Natalie Infante LPN. A1c 7.2 TSH 7.260 Vitamin D 19    Impression and Plan: 1. Encounter for  gynecological examination with Papanicolaou smear of cervix Pap sent Pap in 3 years if normal Physical in 1 year Mammogram yearly Labs yearly and as per PCP Follow up with Dr Nevada Crane for diabetes and BP and cholesterol and gout.   2. Screening for colorectal cancer Colonoscopy at 51  3. Hypothyroidism, unspecified type Will increase synthroid to 225 mcg daily  Recheck TSH and Free T4 in 8 weeks Meds ordered this encounter  Medications  . norethindrone-ethinyl estradiol (VYFEMLA) 0.4-35 MG-MCG tablet    Sig: TAKE 1 TABLET BY MOUTH EVERY DAY CONTINOUSLY    Dispense:  112 tablet    Refill:  4    Order Specific Question:   Supervising Provider    Answer:   Natalie Hernandez [[id number]]  . levothyroxine (SYNTHROID) 25 MCG tablet    Sig: Take 1 daily with 200 mcg to = 225 synthroid recheck in 8 weeks    Dispense:  30 tablet    Refill:  3    Order Specific Question:   Supervising Provider    Answer:   Natalie Hernandez [[id number]]  . levothyroxine (SYNTHROID) 200 MCG tablet    Sig: Take 1 tablet (200 mcg total) by mouth daily before breakfast.    Dispense:  30 tablet    Refill:  6    Order Specific Question:   Supervising Provider    Answer:   Natalie Hernandez [[id number]]  . Cholecalciferol 125 MCG (5000 UT) capsule    Sig: Take 1 capsule (5,000 Units total) by mouth daily.    Order Specific Question:   Supervising Provider  Answer:   EURE, Natalie Hernandez [[id number]]    4. Vitamin D deficiency Take 5000 IU vitmain D 3 daily  5. Encounter for surveillance of contraceptive pills Will refill OCs   6. Class 3 severe obesity due to excess calories with serious comorbidity and body mass index (BMI) of 50.0 to 59.9 in adult [facility]) Try to decrease sugar and walk

## 2019-03-26 LAB — CYTOLOGY - PAP
Adequacy: ABSENT
Comment: NEGATIVE
Diagnosis: NEGATIVE
High risk HPV: NEGATIVE

## 2019-06-07 DIAGNOSIS — M1000 Idiopathic gout, unspecified site: Secondary | ICD-10-CM | POA: Diagnosis not present

## 2019-06-17 DIAGNOSIS — E118 Type 2 diabetes mellitus with unspecified complications: Secondary | ICD-10-CM | POA: Diagnosis not present

## 2019-07-18 DIAGNOSIS — E118 Type 2 diabetes mellitus with unspecified complications: Secondary | ICD-10-CM | POA: Diagnosis not present

## 2019-07-23 DIAGNOSIS — E039 Hypothyroidism, unspecified: Secondary | ICD-10-CM | POA: Diagnosis not present

## 2019-07-26 ENCOUNTER — Other Ambulatory Visit: Payer: Self-pay | Admitting: Adult Health

## 2019-08-23 DIAGNOSIS — M79671 Pain in right foot: Secondary | ICD-10-CM | POA: Diagnosis not present

## 2019-08-23 DIAGNOSIS — Z6841 Body Mass Index (BMI) 40.0 and over, adult: Secondary | ICD-10-CM | POA: Diagnosis not present

## 2019-08-23 DIAGNOSIS — I10 Essential (primary) hypertension: Secondary | ICD-10-CM | POA: Diagnosis not present

## 2019-08-27 ENCOUNTER — Other Ambulatory Visit: Payer: Self-pay | Admitting: Adult Health

## 2019-09-14 ENCOUNTER — Ambulatory Visit (INDEPENDENT_AMBULATORY_CARE_PROVIDER_SITE_OTHER)
Admission: RE | Admit: 2019-09-14 | Discharge: 2019-09-14 | Disposition: A | Discharge status: Home / Self Care | Payer: BC Managed Care – PPO | Source: Ambulatory Visit

## 2019-09-14 DIAGNOSIS — M109 Gout, unspecified: Secondary | ICD-10-CM

## 2019-09-14 MED ORDER — PREDNISONE 10 MG (21) PO TBPK: ORAL_TABLET | ORAL | 0 refills | Status: DC

## 2019-09-14 NOTE — Discharge Instructions (Addendum)
Follow-up with PCP Return for a face-to-face visit if symptom does not resolve 

## 2019-09-14 NOTE — ED Provider Notes (Signed)
Virtual Visit via Video Note:  [name]  initiated request for Telemedicine visit with [facility] team. I connected with [name]  on 09/14/2019 at 11:33 AM  for a synchronized telemedicine visit using a video enabled HIPPA compliant telemedicine application. I verified that I am speaking with [name]  using two identifiers. [name], FNP  was physically located in a [facility] Urgent care site and [name] was located at a different location.   The limitations of evaluation and management by telemedicine as well as the availability of in-person appointments were discussed. Patient was informed that she  may incur a bill ( including co-pay) for this virtual visit encounter. [name]  expressed understanding and gave verbal consent to proceed with virtual visit.     History of Present Illness:Natalie Hernandez  is a 49 y.o. female who presented via to have reviewed complaint of gout exacerbation.  Report acute gout symptom in the right ankle and left big toe.  Has tried diclofenac gel and colchicine without relief.  Denies alleviating or aggravating factor.  Denies chills, fever, nausea, vomiting, diarrhea.  Past Medical History:  Diagnosis Date  . Anxiety and depression 07/01/2015  . Contraceptive management 10/25/2013  . Diabetes ([place]) 11/04/2013  . Dyslipidemia 10/25/2013  . Gout   . Headache(784.0)    menstral migraines  . Hyperlipidemia   . Hypertension   . Hypothyroidism   . Obesity   . Vitamin D deficiency 01/05/2016  . Vitamin D deficiency disease     Allergies  Allergen Reactions  . Celexa [Citalopram Hydrobromide] Rash        Observations/Objective:   Assessment and Plan:   ICD-10-CM   1. Exacerbation of gout  M10.9     Follow Up Instructions: Follow-up with PCP Return for face-to-face visit if symptom does not resolve   I discussed the assessment and treatment plan with the patient. The patient was  provided an opportunity to ask questions and all were answered. The patient agreed with the plan and demonstrated an understanding of the instructions.   The patient was advised to call back or seek an in-person evaluation if the symptoms worsen or if the condition fails to improve as anticipated.  I provided 15  minutes of non-face-to-face time during this encounter.    [name], FNP  09/14/2019 11:48 AM         [name], FNP 09/14/19 1148

## 2019-09-16 ENCOUNTER — Other Ambulatory Visit: Payer: Self-pay

## 2019-09-16 ENCOUNTER — Ambulatory Visit (INDEPENDENT_AMBULATORY_CARE_PROVIDER_SITE_OTHER): Payer: BC Managed Care – PPO

## 2019-09-16 ENCOUNTER — Ambulatory Visit: Payer: BLUE CROSS/BLUE SHIELD | Admitting: Podiatry

## 2019-09-16 ENCOUNTER — Encounter: Payer: Self-pay | Admitting: Podiatry

## 2019-09-16 DIAGNOSIS — M1000 Idiopathic gout, unspecified site: Secondary | ICD-10-CM

## 2019-09-16 DIAGNOSIS — M779 Enthesopathy, unspecified: Secondary | ICD-10-CM

## 2019-09-16 DIAGNOSIS — R52 Pain, unspecified: Secondary | ICD-10-CM

## 2019-09-16 NOTE — Progress Notes (Signed)
Subjective:   Patient ID: Natalie Hernandez, female   DOB: 49 y.o.   MRN: [id number]   HPI Patient presents stating she has a lot of pain in her left big toe joint has had problems with her right ankle and history of gout.  Patient's not been seen for about 3-1/2 years and states this is been very tender for 3 days and she has been on a steroid pack and also Colcrys.  Patient does not smoke likes to be active   Review of Systems  All other systems reviewed and are negative.       Objective:  Physical Exam Vitals and nursing note reviewed.  Constitutional:      Appearance: She is well-developed.  Pulmonary:     Effort: Pulmonary effort is normal.  Musculoskeletal:        General: Normal range of motion.  Skin:    General: Skin is warm.  Neurological:     Mental Status: She is alert.     Neurovascular status found to be intact muscle strength found to be adequate range of motion within normal limits with patient noted to have acute inflammation with redness around the first MPJ left medial side that is painful when pressed and is noted to have mild discomfort in other areas but that has come almost completely settled down.  Patient has good digital perfusion well oriented x3     Assessment:  Acute capsulitis first MPJ left with probability for gout which seems to be getting worse with obesity is complicating factor     Plan:  H&P reviewed all conditions and I am ordering blood work to rule out any kind of issue as far as gout or other arthritic or inflammatory pathology.  For the left I did do sterile prep and injected around the joint 3 mg Kenalog 5 mg Xylocaine to reduce inflammation and patient will be seen back to recheck again in 1 week  X-rays were negative for signs of structural bunion hallux limitus or any other bony type changes associated with this condition

## 2019-09-17 ENCOUNTER — Other Ambulatory Visit: Payer: Self-pay | Admitting: Podiatry

## 2019-09-17 DIAGNOSIS — M1000 Idiopathic gout, unspecified site: Secondary | ICD-10-CM

## 2019-09-17 DIAGNOSIS — M779 Enthesopathy, unspecified: Secondary | ICD-10-CM

## 2019-09-18 LAB — RHEUMATOID FACTOR: Rheumatoid fact SerPl-aCnc: <14 IU/mL (ref <14)

## 2019-09-18 LAB — C-REACTIVE PROTEIN: CRP: 6.3 mg/L (ref <8.0)

## 2019-09-18 LAB — SEDIMENTATION RATE: Sed Rate: 17 mm/h (ref 0–20)

## 2019-09-18 LAB — URIC ACID: Uric Acid, Serum: 7.3 mg/dL — ABNORMAL HIGH (ref 2.5–7.0)

## 2019-09-25 ENCOUNTER — Ambulatory Visit: Payer: BC Managed Care – PPO | Admitting: Podiatry

## 2019-09-25 ENCOUNTER — Other Ambulatory Visit: Payer: Self-pay

## 2019-09-25 ENCOUNTER — Encounter: Payer: Self-pay | Admitting: Podiatry

## 2019-09-25 VITALS — Temp 97.3°F

## 2019-09-25 DIAGNOSIS — M779 Enthesopathy, unspecified: Secondary | ICD-10-CM

## 2019-09-25 DIAGNOSIS — G43909 Migraine, unspecified, not intractable, without status migrainosus: Secondary | ICD-10-CM | POA: Insufficient documentation

## 2019-09-25 DIAGNOSIS — M1000 Idiopathic gout, unspecified site: Secondary | ICD-10-CM

## 2019-09-25 DIAGNOSIS — J40 Bronchitis, not specified as acute or chronic: Secondary | ICD-10-CM | POA: Insufficient documentation

## 2019-09-25 MED ORDER — ALLOPURINOL 100 MG PO TABS: 100.0000 mg | ORAL_TABLET | Freq: Every day | ORAL | 6 refills | Status: DC

## 2019-09-25 NOTE — Progress Notes (Signed)
Subjective:   Patient ID: Natalie Hernandez, female   DOB: 49 y.o.   MRN: [id number]   HPI Patient states her foot feels quite a bit better with medication she is interested in what to do long-term due to chronic nature of these and the fact that she is trying to lose weight and has history of gout attacks    ROS      Objective:  Physical Exam  Neurovascular status found to be intact with patient's left foot showing reduced inflammation around the first MPJ with mild discomfort in her feet in general with patient who is trying to get more active as she continues to work on losing weight     Assessment:  Patient who does have moderate elevation of uric acid level obesity and is on aggressive diet plan which may be creating imbalance     Plan:  H&P condition reviewed and at this point I have recommended the continuation of Colcrys as needed and I am adding on at this time allopurinol 100 mg daily.  I educated her on allopurinol we discussed different pain she is experiencing that some may be related to gout some may be related to inflammation and increased activity as she becomes more agile with weight loss.  Continue to encourage weight loss patient will be seen back to recheck as needed

## 2019-10-17 DIAGNOSIS — E118 Type 2 diabetes mellitus with unspecified complications: Secondary | ICD-10-CM | POA: Diagnosis not present

## 2019-10-22 ENCOUNTER — Other Ambulatory Visit: Payer: Self-pay | Admitting: Adult Health

## 2019-11-16 ENCOUNTER — Other Ambulatory Visit: Payer: Self-pay | Admitting: Adult Health

## 2019-11-18 ENCOUNTER — Other Ambulatory Visit: Payer: Self-pay | Admitting: Adult Health

## 2019-11-18 DIAGNOSIS — W57XXXA Bitten or stung by nonvenomous insect and other nonvenomous arthropods, initial encounter: Secondary | ICD-10-CM | POA: Diagnosis not present

## 2019-11-18 DIAGNOSIS — L299 Pruritus, unspecified: Secondary | ICD-10-CM | POA: Diagnosis not present

## 2019-12-23 DIAGNOSIS — M25512 Pain in left shoulder: Secondary | ICD-10-CM | POA: Diagnosis not present

## 2020-01-05 ENCOUNTER — Other Ambulatory Visit: Payer: Self-pay | Admitting: Adult Health

## 2020-01-10 DIAGNOSIS — M542 Cervicalgia: Secondary | ICD-10-CM | POA: Diagnosis not present

## 2020-01-10 DIAGNOSIS — M25512 Pain in left shoulder: Secondary | ICD-10-CM | POA: Diagnosis not present

## 2020-02-21 DIAGNOSIS — M1A0720 Idiopathic chronic gout, left ankle and foot, without tophus (tophi): Secondary | ICD-10-CM | POA: Diagnosis not present

## 2020-02-24 DIAGNOSIS — M25512 Pain in left shoulder: Secondary | ICD-10-CM | POA: Diagnosis not present

## 2020-03-12 ENCOUNTER — Ambulatory Visit: Payer: BC Managed Care – PPO | Admitting: Podiatry

## 2020-03-12 ENCOUNTER — Ambulatory Visit (INDEPENDENT_AMBULATORY_CARE_PROVIDER_SITE_OTHER): Payer: BC Managed Care – PPO

## 2020-03-12 ENCOUNTER — Other Ambulatory Visit: Payer: Self-pay

## 2020-03-12 ENCOUNTER — Other Ambulatory Visit: Payer: Self-pay | Admitting: Adult Health

## 2020-03-12 ENCOUNTER — Encounter: Payer: Self-pay | Admitting: Podiatry

## 2020-03-12 DIAGNOSIS — M779 Enthesopathy, unspecified: Secondary | ICD-10-CM | POA: Diagnosis not present

## 2020-03-12 DIAGNOSIS — E7800 Pure hypercholesterolemia, unspecified: Secondary | ICD-10-CM

## 2020-03-12 DIAGNOSIS — M1000 Idiopathic gout, unspecified site: Secondary | ICD-10-CM

## 2020-03-12 DIAGNOSIS — E039 Hypothyroidism, unspecified: Secondary | ICD-10-CM

## 2020-03-12 DIAGNOSIS — Z01419 Encounter for gynecological examination (general) (routine) without abnormal findings: Secondary | ICD-10-CM

## 2020-03-12 DIAGNOSIS — R7309 Other abnormal glucose: Secondary | ICD-10-CM

## 2020-03-12 MED ORDER — TRIAMCINOLONE ACETONIDE 10 MG/ML IJ SUSP
10.0000 mg | Freq: Once | INTRAMUSCULAR | Status: AC
  Administered 2020-03-12: 10 mg

## 2020-03-12 NOTE — Progress Notes (Signed)
Check CBC,CMP,TSH and lipids,free T4 and A1c

## 2020-03-15 NOTE — Progress Notes (Signed)
Subjective:   Patient ID: Natalie Hernandez, female   DOB: 49 y.o.   MRN: [id number]   HPI Patient presents stating that she over stepped on a mat felt a pop and it is been aching since Saturday. Does not remember any other issue and states it is moderately tender   ROS      Objective:  Physical Exam  Neurovascular status intact with quite a bit of redness of the outside of the right foot with inflammation around the second and third MPJs right foot. There is fluid buildup in the joints there is redness and also on the outside of the foot with no indication of tendon tear or other pathology     Assessment:  Strong possibility for injury creating inflammatory response with probability for gout inflammation that settled in the second and third MPJs     Plan:  H&P x-ray reviewed sterile prep done aspirated the second and third MPJs getting out a small amount of clear fluid injected quarter cc dexamethasone Kenalog into each joint applied padding rigid bottom shoes oral anti-inflammatories and reappoint to check  X-rays indicate that there is no signs of stress fracture or fracture or other bone pathology with condition

## 2020-04-06 DIAGNOSIS — R7309 Other abnormal glucose: Secondary | ICD-10-CM | POA: Diagnosis not present

## 2020-04-06 DIAGNOSIS — Z01419 Encounter for gynecological examination (general) (routine) without abnormal findings: Secondary | ICD-10-CM | POA: Diagnosis not present

## 2020-04-06 DIAGNOSIS — E039 Hypothyroidism, unspecified: Secondary | ICD-10-CM | POA: Diagnosis not present

## 2020-04-06 DIAGNOSIS — E7800 Pure hypercholesterolemia, unspecified: Secondary | ICD-10-CM | POA: Diagnosis not present

## 2020-04-07 ENCOUNTER — Other Ambulatory Visit: Payer: Self-pay

## 2020-04-07 ENCOUNTER — Other Ambulatory Visit: Payer: BC Managed Care – PPO | Admitting: Adult Health

## 2020-04-07 ENCOUNTER — Encounter: Payer: Self-pay | Admitting: Adult Health

## 2020-04-07 ENCOUNTER — Ambulatory Visit (INDEPENDENT_AMBULATORY_CARE_PROVIDER_SITE_OTHER): Payer: BC Managed Care – PPO | Admitting: Adult Health

## 2020-04-07 VITALS — BP 165/92 | HR 67 | Ht 72.0 in | Wt 365.0 lb

## 2020-04-07 DIAGNOSIS — E7800 Pure hypercholesterolemia, unspecified: Secondary | ICD-10-CM | POA: Diagnosis not present

## 2020-04-07 DIAGNOSIS — Z1211 Encounter for screening for malignant neoplasm of colon: Secondary | ICD-10-CM

## 2020-04-07 DIAGNOSIS — E039 Hypothyroidism, unspecified: Secondary | ICD-10-CM

## 2020-04-07 DIAGNOSIS — Z3041 Encounter for surveillance of contraceptive pills: Secondary | ICD-10-CM

## 2020-04-07 DIAGNOSIS — Z01419 Encounter for gynecological examination (general) (routine) without abnormal findings: Secondary | ICD-10-CM | POA: Diagnosis not present

## 2020-04-07 DIAGNOSIS — E119 Type 2 diabetes mellitus without complications: Secondary | ICD-10-CM

## 2020-04-07 LAB — COMPREHENSIVE METABOLIC PANEL
ALT: 9 IU/L (ref 0–32)
AST: 10 IU/L (ref 0–40)
Albumin/Globulin Ratio: 1.5 (ref 1.2–2.2)
Albumin: 3.7 g/dL — ABNORMAL LOW (ref 3.8–4.8)
Alkaline Phosphatase: 51 IU/L (ref 44–121)
BUN/Creatinine Ratio: 11 (ref 9–23)
BUN: 9 mg/dL (ref 6–24)
Bilirubin Total: 0.2 mg/dL (ref 0.0–1.2)
CO2: 24 mmol/L (ref 20–29)
Calcium: 9.2 mg/dL (ref 8.7–10.2)
Chloride: 97 mmol/L (ref 96–106)
Creatinine, Ser: 0.84 mg/dL (ref 0.57–1.00)
GFR calc Af Amer: 95 mL/min/{1.73_m2} (ref >59)
GFR calc non Af Amer: 82 mL/min/{1.73_m2} (ref >59)
Globulin, Total: 2.4 g/dL (ref 1.5–4.5)
Glucose: 101 mg/dL — ABNORMAL HIGH (ref 65–99)
Potassium: 4.2 mmol/L (ref 3.5–5.2)
Sodium: 135 mmol/L (ref 134–144)
Total Protein: 6.1 g/dL (ref 6.0–8.5)

## 2020-04-07 LAB — HEMOGLOBIN A1C
Est. average glucose Bld gHb Est-mCnc: 131 mg/dL
Hgb A1c MFr Bld: 6.2 % — ABNORMAL HIGH (ref 4.8–5.6)

## 2020-04-07 LAB — CBC
Hematocrit: 42.6 % (ref 34.0–46.6)
Hemoglobin: 13.5 g/dL (ref 11.1–15.9)
MCH: 28.5 pg (ref 26.6–33.0)
MCHC: 31.7 g/dL (ref 31.5–35.7)
MCV: 90 fL (ref 79–97)
Platelets: 249 10*3/uL (ref 150–450)
RBC: 4.73 x10E6/uL (ref 3.77–5.28)
RDW: 12.2 % (ref 11.7–15.4)
WBC: 7.7 10*3/uL (ref 3.4–10.8)

## 2020-04-07 LAB — LIPID PANEL
Chol/HDL Ratio: 2.9 ratio (ref 0.0–4.4)
Cholesterol, Total: 212 mg/dL — ABNORMAL HIGH (ref 100–199)
HDL: 72 mg/dL (ref >39)
LDL Chol Calc (NIH): 104 mg/dL — ABNORMAL HIGH (ref 0–99)
Triglycerides: 216 mg/dL — ABNORMAL HIGH (ref 0–149)
VLDL Cholesterol Cal: 36 mg/dL (ref 5–40)

## 2020-04-07 LAB — TSH: TSH: 0.921 u[IU]/mL (ref 0.450–4.500)

## 2020-04-07 LAB — T4, FREE: Free T4: 1.54 ng/dL (ref 0.82–1.77)

## 2020-04-07 LAB — HEMOCCULT GUIAC POC 1CARD (OFFICE): Fecal Occult Blood, POC: NEGATIVE

## 2020-04-07 NOTE — Progress Notes (Signed)
Patient ID: Natalie Hernandez, female   DOB: August 07, 1970, 49 y.o.   MRN: [id number] History of Present Illness: Natalie Hernandez is a 49 year old white female,married,G1P1 in for a well woman gyn exam, she had a normal pap with negative HPV 03/25/2019. She is working and going back to school.  PCP is Dr Margo Aye.   Current Medications, Allergies, Past Medical History, Past Surgical History, Family History and Social History were reviewed in [employer] record.     Review of Systems: Patient denies any headaches, hearing loss, fatigue, blurred vision, shortness of breath, chest pain, abdominal pain, problems with bowel movements, urination, or intercourse. No joint pain or mood swings. Has lost lost 77 lbs since February but gained some back, she is doing [place] with [employer].   Physical Exam:BP (!) 165/92 (BP Location: Left Arm, Patient Position: Sitting, Cuff Size: Normal)   Pulse 67   Ht 6' (1.829 m)   Wt (!) 365 lb (165.6 kg)   BMI 49.50 kg/m  BP was 120/80s this am  General:  Well developed, well nourished, no acute distress Skin:  Warm and dry Neck:  Midline trachea, normal thyroid, good ROM, no lymphadenopathy Lungs; Clear to auscultation bilaterally Breast:  No dominant palpable mass, retraction, or nipple discharge Cardiovascular: Regular rate and rhythm Abdomen:  Soft, non tender, no hepatosplenomegaly Pelvic:  External genitalia is normal in appearance, no lesions.  The vagina is normal in appearance. Urethra has no lesions or masses. The cervix is bulbous.  Uterus is felt to be normal size, shape, and contour.  No adnexal masses or tenderness noted.Bladder is non tender, no masses felt. Rectal: Good sphincter tone, no polyps, or hemorrhoids felt.  Hemoccult negative. Extremities/musculoskeletal:  No swelling or varicosities noted, no clubbing or cyanosis Psych:  No mood changes, alert and cooperative,seems happy Reviewed labs with her during visit.  AA is 1 Fall risk is  low PHQ 9 score is 5 GAD 7 score is 4.   [facility] - 04/07/20 1458      Pregnancy Intention Screening   Does the patient want to become pregnant in the next year? No    Does the patient's partner want to become pregnant in the next year? No    Would the patient like to discuss contraceptive options today? No      Contraception Wrap Up   Current Method Oral Contraceptive    End Method Oral Contraceptive    Contraception Counseling Provided No         Examination chaperoned by Faith Rogue LPN  Impression and Plan: 1. Encounter for well woman exam with routine gynecological exam Physical in 1 year Pap in 2023 Mammogram due Colonoscopy at 51  2. Encounter for screening fecal occult blood testing  3. Hypothyroidism, unspecified type Continue synthroid 225 mcg 1 daily has refills   4. Elevated cholesterol Continue zocor has refills  5. Diabetes mellitus without complication (HCC) Continue metformin has refills   6. Morbid obesity (HCC) Go back on Virta  7. Contraceptive management Continue pills, has refills

## 2020-04-08 ENCOUNTER — Other Ambulatory Visit: Payer: Self-pay | Admitting: Adult Health

## 2020-04-24 ENCOUNTER — Other Ambulatory Visit: Payer: Self-pay | Admitting: Women's Health

## 2020-04-24 DIAGNOSIS — Z1152 Encounter for screening for COVID-19: Secondary | ICD-10-CM | POA: Diagnosis not present

## 2020-04-30 ENCOUNTER — Other Ambulatory Visit: Payer: Self-pay | Admitting: Adult Health

## 2020-04-30 MED ORDER — METFORMIN HCL 1000 MG PO TABS: ORAL_TABLET | ORAL | 3 refills | Status: DC

## 2020-04-30 NOTE — Progress Notes (Signed)
Refill metformin

## 2020-05-04 DIAGNOSIS — R0602 Shortness of breath: Secondary | ICD-10-CM | POA: Diagnosis not present

## 2020-05-04 DIAGNOSIS — U071 COVID-19: Secondary | ICD-10-CM | POA: Diagnosis not present

## 2020-05-04 DIAGNOSIS — R079 Chest pain, unspecified: Secondary | ICD-10-CM | POA: Diagnosis not present

## 2020-05-05 ENCOUNTER — Other Ambulatory Visit: Payer: Self-pay

## 2020-05-05 ENCOUNTER — Ambulatory Visit (HOSPITAL_COMMUNITY)
Admission: RE | Admit: 2020-05-05 | Discharge: 2020-05-05 | Disposition: A | Discharge status: Home / Self Care | Payer: BC Managed Care – PPO | Source: Ambulatory Visit | Attending: Internal Medicine | Admitting: Internal Medicine

## 2020-05-05 ENCOUNTER — Other Ambulatory Visit (HOSPITAL_COMMUNITY): Payer: Self-pay | Admitting: Internal Medicine

## 2020-05-05 DIAGNOSIS — R0602 Shortness of breath: Secondary | ICD-10-CM | POA: Diagnosis not present

## 2020-05-05 DIAGNOSIS — T81718A Complication of other artery following a procedure, not elsewhere classified, initial encounter: Secondary | ICD-10-CM | POA: Diagnosis not present

## 2020-05-05 DIAGNOSIS — R059 Cough, unspecified: Secondary | ICD-10-CM | POA: Diagnosis not present

## 2020-05-05 DIAGNOSIS — I2699 Other pulmonary embolism without acute cor pulmonale: Secondary | ICD-10-CM

## 2020-05-05 DIAGNOSIS — R079 Chest pain, unspecified: Secondary | ICD-10-CM | POA: Diagnosis not present

## 2020-05-07 ENCOUNTER — Other Ambulatory Visit (HOSPITAL_COMMUNITY): Payer: Self-pay | Admitting: Internal Medicine

## 2020-05-07 ENCOUNTER — Other Ambulatory Visit: Payer: Self-pay | Admitting: Internal Medicine

## 2020-05-07 DIAGNOSIS — U099 Post covid-19 condition, unspecified: Secondary | ICD-10-CM | POA: Diagnosis not present

## 2020-05-07 DIAGNOSIS — I2699 Other pulmonary embolism without acute cor pulmonale: Secondary | ICD-10-CM

## 2020-05-07 DIAGNOSIS — I10 Essential (primary) hypertension: Secondary | ICD-10-CM | POA: Diagnosis not present

## 2020-05-07 DIAGNOSIS — F411 Generalized anxiety disorder: Secondary | ICD-10-CM | POA: Diagnosis not present

## 2020-05-14 ENCOUNTER — Other Ambulatory Visit: Payer: Self-pay | Admitting: Adult Health

## 2020-06-16 DIAGNOSIS — E118 Type 2 diabetes mellitus with unspecified complications: Secondary | ICD-10-CM | POA: Diagnosis not present

## 2020-09-05 ENCOUNTER — Ambulatory Visit
Admission: EM | Admit: 2020-09-05 | Discharge: 2020-09-05 | Disposition: A | Discharge status: Home / Self Care | Payer: BC Managed Care – PPO | Attending: Family Medicine | Admitting: Family Medicine

## 2020-09-05 ENCOUNTER — Encounter: Payer: Self-pay | Admitting: Emergency Medicine

## 2020-09-05 ENCOUNTER — Ambulatory Visit (INDEPENDENT_AMBULATORY_CARE_PROVIDER_SITE_OTHER): Payer: BC Managed Care – PPO

## 2020-09-05 ENCOUNTER — Other Ambulatory Visit: Payer: Self-pay

## 2020-09-05 DIAGNOSIS — M79672 Pain in left foot: Secondary | ICD-10-CM

## 2020-09-05 DIAGNOSIS — M19072 Primary osteoarthritis, left ankle and foot: Secondary | ICD-10-CM | POA: Diagnosis not present

## 2020-09-05 DIAGNOSIS — S93602A Unspecified sprain of left foot, initial encounter: Secondary | ICD-10-CM

## 2020-09-05 MED ORDER — CYCLOBENZAPRINE HCL 10 MG PO TABS: 10.0000 mg | ORAL_TABLET | Freq: Two times a day (BID) | ORAL | 0 refills | Status: DC | PRN

## 2020-09-05 MED ORDER — TRAMADOL HCL 50 MG PO TABS: 50.0000 mg | ORAL_TABLET | Freq: Four times a day (QID) | ORAL | 0 refills | Status: DC | PRN

## 2020-09-05 MED ORDER — PREDNISONE 10 MG (21) PO TBPK: ORAL_TABLET | Freq: Every day | ORAL | 0 refills | Status: AC

## 2020-09-05 NOTE — ED Provider Notes (Signed)
[facility]    CSN: [id number] Arrival date & time: 09/05/20  1210      History   Chief Complaint No chief complaint on file.   HPI Natalie Hernandez is a 50 y.o. female.   Reports that she had a cramp in her left lower leg about 3 days ago.  States that she also has history of gout.  Reports that ever since then she has been having pain to the dorsal surface of her left foot.  Reports the area is erythematous, swollen, and the only time it does not hurt is when she has her foot flat on the floor.  She has taken prednisone, colchicine, methocarbamol and diclofenac with little relief.  States that she is having muscle spasms in the left calf as well.  Denies previous symptoms.  Denies numbness, tingling, loss of sensation, loss of strength, radiating pain, known injury, other symptoms.  ROS per HPI  The history is provided by the patient.    Past Medical History:  Diagnosis Date  . Anxiety and depression 07/01/2015  . Contraceptive management 10/25/2013  . Diabetes ([place]) 11/04/2013  . Dyslipidemia 10/25/2013  . Gout   . Headache(784.0)    menstral migraines  . Hyperlipidemia   . Hypertension   . Hypothyroidism   . Obesity   . Vitamin D deficiency 01/05/2016  . Vitamin D deficiency disease     Patient Active Problem List   Diagnosis Date Noted  . Encounter for screening fecal occult blood testing 04/07/2020  . Bronchitis 09/25/2019  . Migraine 09/25/2019  . Encounter for gynecological examination with Papanicolaou smear of cervix 03/25/2019  . Elevated hemoglobin A1c 01/12/2018  . Encounter for surveillance of contraceptive pills 01/12/2018  . Essential hypertension 01/12/2018  . Screening for colorectal cancer 01/12/2018  . Diabetes mellitus without complication ([place]) 29/52/8413  . Class 3 severe obesity due to excess calories with serious comorbidity and body mass index (BMI) of 50.0 to 59.9 in adult ([place]) 01/12/2018  . Poor sleep pattern 09/13/2017  .  Encounter for well woman exam with routine gynecological exam 01/06/2017  . Palpitations 06/01/2016  . Vitamin D deficiency 01/05/2016  . Anxiety and depression 07/01/2015  . Hyperlipidemia with target LDL less than 100 07/31/2014  . Idiopathic chronic gout of multiple sites without tophus 07/31/2014  . Morbid obesity with BMI of 50.0-59.9, adult ([place]) 07/31/2014  . Non-insulin treated type 2 diabetes mellitus ([place]) 11/04/2013  . Dyslipidemia 10/25/2013  . Contraceptive management 10/25/2013  . Hypothyroidism 10/05/2012  . Hypertension 10/05/2012  . Morbid obesity ([place]) 10/05/2012    Past Surgical History:  Procedure Laterality Date  . CESAREAN SECTION    . GALLBLADDER SURGERY      OB History    Gravida  1   Para  1   Term  1   Preterm      AB      Living  1     SAB      IAB      Ectopic      Multiple      Live Births  1            Home Medications    Prior to Admission medications   Medication Sig Start Date End Date Taking? Authorizing Provider  cyclobenzaprine (FLEXERIL) 10 MG tablet Take 1 tablet (10 mg total) by mouth 2 (two) times daily as needed for muscle spasms. 09/05/20  Yes Faustino Congress, NP  predniSONE (STERAPRED UNI-PAK 21 TAB)  10 MG (21) TBPK tablet Take by mouth daily for 6 days. Take 6 tablets on day 1, 5 tablets on day 2, 4 tablets on day 3, 3 tablets on day 4, 2 tablets on day 5, 1 tablet on day 6 09/05/20 09/11/20 Yes Faustino Congress, NP  traMADol (ULTRAM) 50 MG tablet Take 1 tablet (50 mg total) by mouth every 6 (six) hours as needed. 09/05/20  Yes Faustino Congress, NP  allopurinol (ZYLOPRIM) 100 MG tablet Take 1 tablet (100 mg total) by mouth daily. 09/25/19   Wallene Huh, DPM  ALPRAZolam (XANAX) 0.25 MG tablet TAKE 1 TABLET(0.25 MG) BY MOUTH TWICE DAILY AS NEEDED FOR ANXIETY 07/29/19   Estill Dooms, NP  BRIELLYN 0.4-35 MG-MCG tablet TAKE 1 TABLET BY MOUTH EVERY DAY CONTINUOUSLY 08/27/19   Estill Dooms, NP   Cholecalciferol 125 MCG (5000 UT) capsule Take 1 capsule (5,000 Units total) by mouth daily. Patient not taking: Reported on 04/07/2020 03/25/19   Derrek Monaco A, NP  colchicine 0.6 MG tablet Take by mouth. Patient not taking: Reported on 04/07/2020    [provider]  diclofenac (VOLTAREN) 75 MG EC tablet TAKE 1 TABLET BY MOUTH TWICE DAILY FOR 2 WEEKS THEN AS NEEDED FOR INFLAMMATION Patient not taking: Reported on 04/07/2020 08/23/19   [provider]  levothyroxine (SYNTHROID) 200 MCG tablet TAKE 1 TABLET(200 MCG) BY MOUTH DAILY BEFORE BREAKFAST 05/14/20   Estill Dooms, NP  levothyroxine (SYNTHROID) 200 MCG tablet TAKE 1 TABLET(200 MCG) BY MOUTH DAILY BEFORE BREAKFAST 05/14/20   Estill Dooms, NP  levothyroxine (SYNTHROID) 25 MCG tablet TAKE 1 TABLET BY MOUTH EVERY DAY WITH 200 MCG 04/08/20   Estill Dooms, NP  metFORMIN (GLUCOPHAGE) 1000 MG tablet TAKE 1 TABLET BY MOUTH TWICE DAILY WITH MEAL 04/30/20   Derrek Monaco A, NP  metoprolol succinate (TOPROL-XL) 50 MG 24 hr tablet Take 25 mg by mouth 2 (two) times daily. 02/28/19   [provider]  simvastatin (ZOCOR) 20 MG tablet TAKE 1 TABLET BY MOUTH EVERY DAY 10/23/19   Estill Dooms, NP    Family History Family History  Problem Relation Age of Onset  . Hypertension Father   . Diabetes Brother   . Cancer Maternal Aunt        vaginal  . Stroke Paternal Aunt   . Cancer Maternal Grandmother        colon  . CAD Paternal Grandfather   . Stroke Paternal Grandmother   . Cancer Maternal Grandfather        colon  . Diabetes Mother   . Other Brother        was shot    Social History Social History   Tobacco Use  . Smoking status: Never Smoker  . Smokeless tobacco: Never Used  Vaping Use  . Vaping Use: Never used  Substance Use Topics  . Alcohol use: Yes    Comment: occassional  . Drug use: No     Allergies   Celexa [citalopram hydrobromide]   Review of Systems Review  of Systems   Physical Exam Triage Vital Signs ED Triage Vitals  Enc Vitals Group     BP 09/05/20 1253 (!) 178/95     Pulse Rate 09/05/20 1253 73     Resp 09/05/20 1253 18     Temp 09/05/20 1253 98.2 F (36.8 C)     Temp Source 09/05/20 1253 Oral     SpO2 09/05/20 1253 96 %  Weight --      Height --      Head Circumference --      Peak Flow --      Pain Score 09/05/20 1256 5     Pain Loc --      Pain Edu? --      Excl. in [place]? --    No data found.  Updated Vital Signs BP (!) 178/95 (BP Location: Right Arm)   Pulse 73   Temp 98.2 F (36.8 C) (Oral)   Resp 18   SpO2 96%       Physical Exam Vitals and nursing note reviewed.  Constitutional:      General: She is not in acute distress.    Appearance: She is well-developed.  HENT:     Head: Normocephalic and atraumatic.     Nose: Nose normal.     Mouth/Throat:     Mouth: Mucous membranes are moist.     Pharynx: Oropharynx is clear.  Eyes:     Extraocular Movements: Extraocular movements intact.     Conjunctiva/sclera: Conjunctivae normal.     Pupils: Pupils are equal, round, and reactive to light.  Cardiovascular:     Rate and Rhythm: Normal rate and regular rhythm.  Pulmonary:     Effort: Pulmonary effort is normal. No respiratory distress.  Musculoskeletal:        General: Swelling and tenderness present.     Cervical back: Normal range of motion and neck supple.     Comments: Swelling, tenderness, erythema noted to medial dorsal surface of the left foot Swelling, tenderness extends into medial and lateral left ankle  Skin:    General: Skin is warm and dry.     Capillary Refill: Capillary refill takes less than 2 seconds.  Neurological:     General: No focal deficit present.     Mental Status: She is alert and oriented to person, place, and time.  Psychiatric:        Mood and Affect: Mood normal.        Behavior: Behavior normal.        Thought Content: Thought content normal.      UC Treatments /  Results  Labs (all labs ordered are listed, but only abnormal results are displayed) Labs Reviewed - No data to display  EKG   Radiology DG Foot Complete Left  Result Date: 09/05/2020 CLINICAL DATA:  Pain on top of foot, no trauma EXAM: LEFT FOOT - COMPLETE 3+ VIEW COMPARISON:  None. FINDINGS: There is no evidence of fracture or dislocation. Mild arthrosis of the first metatarsophalangeal joint and midfoot. Diffuse soft tissue edema. IMPRESSION: 1. No fracture or dislocation of the left foot. Mild arthrosis of the first metatarsophalangeal joint and midfoot. 2.  Diffuse soft tissue edema. Electronically Signed   By: Eddie Candle M.D.   On: 09/05/2020 13:23    Procedures Procedures (including critical care time)  Medications Ordered in UC Medications - No data to display  Initial Impression / Assessment and Plan / UC Course  I have reviewed the triage vital signs and the nursing notes.  Pertinent labs & imaging results that were available during my care of the patient were reviewed by me and considered in my medical decision making (see chart for details).    Left foot pain Left foot sprain  X-rays negative today for fracture or misalignment Steroid taper prescribed Flexeril prescribed Do not take this with methocarbamol  Sedation precautions given May continue with diclofenac,  do not take ibuprofen with this medication May take Tylenol Tramadol prescribed for breakthrough pain Ace wrap applied to left foot and ankle in the office Wear this when up and active Follow-up with Dr. Aline Brochure if symptoms are persisting  Final Clinical Impressions(s) / UC Diagnoses   Final diagnoses:  Left foot pain  Foot sprain, left, initial encounter     Discharge Instructions     Xray negative for any fracture or misalignment  I have sent in a prednisone taper for you to take for 6 days. 6 tablets on day one, 5 tablets on day two, 4 tablets on day three, 3 tablets on day four, 2  tablets on day five, and 1 tablet on day six.  I have sent in flexeril for you to take twice a day as needed for muscle spasms. This medication can make you sleepy. Do not drive or operate heavy machinery with this medication.  I have sent in tramadol for you to take for breakthrough pain, 1 tablet every 6 hours as needed  Follow up with this office or with primary care if symptoms are persisting.  Follow up in the ER for high fever, trouble swallowing, trouble breathing, other concerning symptoms.     ED Prescriptions    Medication Sig Dispense Auth. Provider   predniSONE (STERAPRED UNI-PAK 21 TAB) 10 MG (21) TBPK tablet Take by mouth daily for 6 days. Take 6 tablets on day 1, 5 tablets on day 2, 4 tablets on day 3, 3 tablets on day 4, 2 tablets on day 5, 1 tablet on day 6 21 tablet Faustino Congress, NP   cyclobenzaprine (FLEXERIL) 10 MG tablet Take 1 tablet (10 mg total) by mouth 2 (two) times daily as needed for muscle spasms. 20 tablet Faustino Congress, NP   traMADol (ULTRAM) 50 MG tablet Take 1 tablet (50 mg total) by mouth every 6 (six) hours as needed. 15 tablet Faustino Congress, NP     I have reviewed the PDMP during this encounter.   Faustino Congress, NP 09/05/20 1352

## 2020-09-05 NOTE — Discharge Instructions (Addendum)
Xray negative for any fracture or misalignment  I have sent in a prednisone taper for you to take for 6 days. 6 tablets on day one, 5 tablets on day two, 4 tablets on day three, 3 tablets on day four, 2 tablets on day five, and 1 tablet on day six.  I have sent in flexeril for you to take twice a day as needed for muscle spasms. This medication can make you sleepy. Do not drive or operate heavy machinery with this medication.  I have sent in tramadol for you to take for breakthrough pain, 1 tablet every 6 hours as needed  Follow up with this office or with primary care if symptoms are persisting.  Follow up in the ER for high fever, trouble swallowing, trouble breathing, other concerning symptoms.

## 2020-09-05 NOTE — ED Triage Notes (Signed)
Cramp Friday in left lower leg.  Now has pain on top of left foot.

## 2020-09-30 ENCOUNTER — Other Ambulatory Visit: Payer: Self-pay | Admitting: Adult Health

## 2020-10-29 ENCOUNTER — Other Ambulatory Visit: Payer: Self-pay | Admitting: Adult Health

## 2020-10-30 ENCOUNTER — Other Ambulatory Visit: Payer: Self-pay

## 2020-10-30 MED ORDER — LEVOTHYROXINE SODIUM 25 MCG PO TABS: ORAL_TABLET | ORAL | 0 refills | Status: DC

## 2020-11-10 ENCOUNTER — Other Ambulatory Visit: Payer: Self-pay | Admitting: Adult Health

## 2021-01-31 ENCOUNTER — Other Ambulatory Visit: Payer: Self-pay | Admitting: Women's Health

## 2021-02-01 ENCOUNTER — Other Ambulatory Visit: Payer: Self-pay

## 2021-02-02 ENCOUNTER — Other Ambulatory Visit: Payer: Self-pay

## 2021-02-02 MED ORDER — SIMVASTATIN 20 MG PO TABS: 20.0000 mg | ORAL_TABLET | Freq: Every day | ORAL | 2 refills | Status: AC

## 2021-03-02 ENCOUNTER — Other Ambulatory Visit (HOSPITAL_COMMUNITY): Payer: Self-pay | Admitting: Adult Health

## 2021-03-02 DIAGNOSIS — Z1231 Encounter for screening mammogram for malignant neoplasm of breast: Secondary | ICD-10-CM

## 2021-03-08 ENCOUNTER — Ambulatory Visit (HOSPITAL_COMMUNITY)
Admission: RE | Admit: 2021-03-08 | Discharge: 2021-03-08 | Disposition: A | Discharge status: Home / Self Care | Payer: BC Managed Care – PPO | Source: Ambulatory Visit | Attending: Nurse Practitioner | Admitting: Nurse Practitioner

## 2021-03-08 ENCOUNTER — Other Ambulatory Visit: Payer: Self-pay

## 2021-03-08 DIAGNOSIS — Z1231 Encounter for screening mammogram for malignant neoplasm of breast: Secondary | ICD-10-CM | POA: Diagnosis not present

## 2021-03-25 ENCOUNTER — Other Ambulatory Visit: Payer: Self-pay

## 2021-03-25 MED ORDER — LEVOTHYROXINE SODIUM 25 MCG PO TABS: ORAL_TABLET | ORAL | 0 refills | Status: DC

## 2021-04-08 ENCOUNTER — Other Ambulatory Visit: Payer: Self-pay

## 2021-04-08 ENCOUNTER — Ambulatory Visit (INDEPENDENT_AMBULATORY_CARE_PROVIDER_SITE_OTHER): Payer: BC Managed Care – PPO | Admitting: Adult Health

## 2021-04-08 ENCOUNTER — Encounter: Payer: Self-pay | Admitting: Adult Health

## 2021-04-08 VITALS — BP 152/80 | HR 84 | Ht 72.0 in | Wt >= 6400 oz

## 2021-04-08 DIAGNOSIS — Z6841 Body Mass Index (BMI) 40.0 and over, adult: Secondary | ICD-10-CM

## 2021-04-08 DIAGNOSIS — Z1211 Encounter for screening for malignant neoplasm of colon: Secondary | ICD-10-CM

## 2021-04-08 DIAGNOSIS — E119 Type 2 diabetes mellitus without complications: Secondary | ICD-10-CM | POA: Diagnosis not present

## 2021-04-08 DIAGNOSIS — I10 Essential (primary) hypertension: Secondary | ICD-10-CM

## 2021-04-08 DIAGNOSIS — E039 Hypothyroidism, unspecified: Secondary | ICD-10-CM

## 2021-04-08 DIAGNOSIS — Z01419 Encounter for gynecological examination (general) (routine) without abnormal findings: Secondary | ICD-10-CM

## 2021-04-08 DIAGNOSIS — Z3041 Encounter for surveillance of contraceptive pills: Secondary | ICD-10-CM

## 2021-04-08 DIAGNOSIS — Z1212 Encounter for screening for malignant neoplasm of rectum: Secondary | ICD-10-CM

## 2021-04-08 LAB — HEMOCCULT GUIAC POC 1CARD (OFFICE): Fecal Occult Blood, POC: NEGATIVE

## 2021-04-08 NOTE — Progress Notes (Signed)
Patient ID: Natalie Hernandez, female   DOB: 04-13-1970, 50 y.o.   MRN: [id number] History of Present Illness: Natalie Hernandez is a 50 year old white female, married, G1P1 in for a well woman gyn exam. She had COVID this year. She had labs 02/15/21 for [employer], her insurance program. A1c 6.6. TC 218, will get scanned in.   Lab Results  Component Value Date   DIAGPAP  03/25/2019    - Negative for intraepithelial lesion or malignancy (NILM)   [place] Negative 03/25/2019   PCP is Dr Nevada Crane.  Current Medications, Allergies, Past Medical History, Past Surgical History, Family History and Social History were reviewed in [employer] record.     Review of Systems: Patient denies any headaches, hearing loss, fatigue, blurred vision, shortness of breath, chest pain, abdominal pain, problems with bowel movements, urination, or intercourse. No joint pain or mood swings.     Physical Exam:BP (!) 152/80 (BP Location: Right Wrist, Patient Position: Sitting, Cuff Size: Normal)    Pulse 84    Ht 6' (1.829 m)    Wt (!) 401 lb 6.4 oz (182.1 kg)    BMI 54.44 kg/m   General:  Well developed, well nourished, no acute distress Skin:  Warm and dry Neck:  Midline trachea, normal thyroid, good ROM, no lymphadenopathy Lungs; Clear to auscultation bilaterally Breast:  No dominant palpable mass, retraction, or nipple discharge Cardiovascular: Regular rate and rhythm Abdomen:  Soft, non tender, no hepatosplenomegaly Pelvic:  External genitalia is normal in appearance, no lesions.  The vagina is normal in appearance. Urethra has no lesions or masses. The cervix is bulbous.  Uterus is felt to be normal size, shape, and contour.  No adnexal masses or tenderness noted.Bladder is non tender, no masses felt. Rectal: Good sphincter tone, no polyps, or hemorrhoids felt.  Hemoccult negative. Extremities/musculoskeletal:  No swelling or varicosities noted, no clubbing or cyanosis Psych:  No mood changes, alert and  cooperative,seems happy AA is 1 Fall risk is low Depression screen [facility] 2/9 04/08/2021 04/07/2020 03/25/2019  Decreased Interest 0 0 0  Down, Depressed, Hopeless 0 1 0  PHQ - 2 Score 0 1 0  Altered sleeping 1 1 -  Tired, decreased energy 1 1 -  Change in appetite 1 1 -  Feeling bad or failure about yourself  0 1 -  Trouble concentrating 0 0 -  Moving slowly or fidgety/restless 0 0 -  Suicidal thoughts 0 0 -  PHQ-9 Score 3 5 -    GAD 7 : Generalized Anxiety Score 04/08/2021 04/07/2020  Nervous, Anxious, on Edge 1 1  Control/stop worrying 0 1  Worry too much - different things 1 1  Trouble relaxing 0 1  Restless 0 0  Easily annoyed or irritable 0 0  Afraid - awful might happen 0 0  Total GAD 7 Score 2 4    Upstream - 04/08/21 1023       Pregnancy Intention Screening   Does the patient want to become pregnant in the next year? No    Does the patient's partner want to become pregnant in the next year? No    Would the patient like to discuss contraceptive options today? No      Contraception Wrap Up   Current Method Oral Contraceptive    End Method Oral Contraceptive    Contraception Counseling Provided No              Examination chaperoned by Celene Squibb LPN  Impression  and Plan: 1. Encounter for well woman exam with routine gynecological exam Pap and physical in 1 year Mammogram yearly  2. Encounter for screening fecal occult blood testing   3. Essential hypertension On toprol   4. Diabetes mellitus without complication (HCC) On Glucophage 1000 mg bid has refills On Zocor 20 mg has refills   5. Class 3 severe obesity due to excess calories with serious comorbidity and body mass index (BMI) of 50.0 to 59.9 in adult [facility]) Try to work on losing weight this year   6. Encounter for surveillance of contraceptive pills Continue briellyn has refills   7. Hypothyroidism, unspecified type Check TSH and free T4  On synthroid 225 mg has refills  8. Screening  for colorectal cancer Will order Cologuard, she declines colonoscopy

## 2021-04-09 ENCOUNTER — Other Ambulatory Visit: Payer: BC Managed Care – PPO | Admitting: Obstetrics & Gynecology

## 2021-05-02 ENCOUNTER — Other Ambulatory Visit: Payer: Self-pay | Admitting: Adult Health

## 2021-05-06 DIAGNOSIS — I10 Essential (primary) hypertension: Secondary | ICD-10-CM | POA: Diagnosis not present

## 2021-05-09 ENCOUNTER — Other Ambulatory Visit: Payer: Self-pay | Admitting: Adult Health

## 2021-05-14 DIAGNOSIS — E785 Hyperlipidemia, unspecified: Secondary | ICD-10-CM | POA: Diagnosis not present

## 2021-05-14 DIAGNOSIS — I10 Essential (primary) hypertension: Secondary | ICD-10-CM | POA: Diagnosis not present

## 2021-05-14 DIAGNOSIS — E1165 Type 2 diabetes mellitus with hyperglycemia: Secondary | ICD-10-CM | POA: Diagnosis not present

## 2021-06-18 DIAGNOSIS — E039 Hypothyroidism, unspecified: Secondary | ICD-10-CM | POA: Diagnosis not present

## 2021-06-18 DIAGNOSIS — M109 Gout, unspecified: Secondary | ICD-10-CM | POA: Diagnosis not present

## 2021-06-19 LAB — TSH: TSH: 1.55 u[IU]/mL (ref 0.450–4.500)

## 2021-06-19 LAB — T4, FREE: Free T4: 1.77 ng/dL (ref 0.82–1.77)

## 2021-07-01 ENCOUNTER — Other Ambulatory Visit: Payer: Self-pay | Admitting: Adult Health

## 2021-07-02 DIAGNOSIS — I10 Essential (primary) hypertension: Secondary | ICD-10-CM | POA: Diagnosis not present

## 2021-07-02 DIAGNOSIS — E1165 Type 2 diabetes mellitus with hyperglycemia: Secondary | ICD-10-CM | POA: Diagnosis not present

## 2021-07-02 DIAGNOSIS — M109 Gout, unspecified: Secondary | ICD-10-CM | POA: Diagnosis not present

## 2021-08-13 DIAGNOSIS — Z6841 Body Mass Index (BMI) 40.0 and over, adult: Secondary | ICD-10-CM | POA: Diagnosis not present

## 2021-08-13 DIAGNOSIS — I10 Essential (primary) hypertension: Secondary | ICD-10-CM | POA: Diagnosis not present

## 2021-08-13 DIAGNOSIS — E1165 Type 2 diabetes mellitus with hyperglycemia: Secondary | ICD-10-CM | POA: Diagnosis not present

## 2021-09-30 ENCOUNTER — Other Ambulatory Visit: Payer: Self-pay | Admitting: Adult Health

## 2021-10-22 DIAGNOSIS — F419 Anxiety disorder, unspecified: Secondary | ICD-10-CM | POA: Diagnosis not present

## 2021-10-22 DIAGNOSIS — E1165 Type 2 diabetes mellitus with hyperglycemia: Secondary | ICD-10-CM | POA: Diagnosis not present

## 2021-11-05 ENCOUNTER — Other Ambulatory Visit: Payer: Self-pay | Admitting: Adult Health

## 2021-11-28 ENCOUNTER — Other Ambulatory Visit: Payer: Self-pay | Admitting: Adult Health

## 2022-01-04 ENCOUNTER — Ambulatory Visit
Admission: RE | Admit: 2022-01-04 | Discharge: 2022-01-04 | Disposition: A | Discharge status: Home / Self Care | Payer: BC Managed Care – PPO | Source: Ambulatory Visit

## 2022-01-04 ENCOUNTER — Ambulatory Visit (INDEPENDENT_AMBULATORY_CARE_PROVIDER_SITE_OTHER): Payer: BC Managed Care – PPO

## 2022-01-04 VITALS — BP 145/76 | HR 82 | Temp 98.1°F | Resp 16

## 2022-01-04 DIAGNOSIS — M79672 Pain in left foot: Secondary | ICD-10-CM | POA: Diagnosis not present

## 2022-01-04 DIAGNOSIS — W19XXXA Unspecified fall, initial encounter: Secondary | ICD-10-CM

## 2022-01-04 DIAGNOSIS — S93602A Unspecified sprain of left foot, initial encounter: Secondary | ICD-10-CM

## 2022-01-04 MED ORDER — CYCLOBENZAPRINE HCL 10 MG PO TABS: 10.0000 mg | ORAL_TABLET | Freq: Two times a day (BID) | ORAL | 0 refills | Status: DC | PRN

## 2022-01-04 MED ORDER — KETOROLAC TROMETHAMINE 30 MG/ML IJ SOLN
30.0000 mg | Freq: Once | INTRAMUSCULAR | Status: AC
  Administered 2022-01-04: 30 mg via INTRAMUSCULAR

## 2022-01-04 MED ORDER — PREDNISONE 20 MG PO TABS: 20.0000 mg | ORAL_TABLET | Freq: Every day | ORAL | 0 refills | Status: AC

## 2022-01-04 NOTE — ED Provider Notes (Signed)
[facility]    CSN: [id number] Arrival date & time: 01/04/22  1119      History   Chief Complaint Chief Complaint  Patient presents with   Foot Pain    Entered by patient    HPI Natalie Hernandez is a 51 y.o. female.   The history is provided by the patient.   The patient presents for complaints of left foot pain that started over the last several days.  Patient states that she lost her balance and caught herself, but since that time she has had cramping and pain to the tendon under the big toe of the left foot.  She states that she had the same or similar symptoms 1 year ago.  Patient states she has been using ice and ibuprofen for her symptoms.  She states that her foot feels better when it is flat, but when she walks, the pain radiates into the ankle and into the big toe.  She denies numbness, tingling, or inability to bear weight.  She states that she does have a history of gout and takes allopurinol daily.  She states that her symptoms do not feel like gout.  States that she tried using a postop shoe and Ace wrap with minimal relief.  Past Medical History:  Diagnosis Date   Anxiety and depression 07/01/2015   Contraceptive management 10/25/2013   Diabetes ([place]) 11/04/2013   Dyslipidemia 10/25/2013   Gout    Headache(784.0)    menstral migraines   Hyperlipidemia    Hypertension    Hypothyroidism    Obesity    Vitamin D deficiency 01/05/2016   Vitamin D deficiency disease     Patient Active Problem List   Diagnosis Date Noted   Encounter for screening fecal occult blood testing 04/07/2020   Bronchitis 09/25/2019   Migraine 09/25/2019   Encounter for gynecological examination with Papanicolaou smear of cervix 03/25/2019   Elevated hemoglobin A1c 01/12/2018   Encounter for surveillance of contraceptive pills 01/12/2018   Essential hypertension 01/12/2018   Screening for colorectal cancer 01/12/2018   Diabetes mellitus without complication ([place]) 57/32/2025    Class 3 severe obesity due to excess calories with serious comorbidity and body mass index (BMI) of 50.0 to 59.9 in adult ([place]) 01/12/2018   Poor sleep pattern 09/13/2017   Encounter for well woman exam with routine gynecological exam 01/06/2017   Palpitations 06/01/2016   Vitamin D deficiency 01/05/2016   Anxiety and depression 07/01/2015   Hyperlipidemia with target LDL less than 100 07/31/2014   Idiopathic chronic gout of multiple sites without tophus 07/31/2014   Morbid obesity with BMI of 50.0-59.9, adult ([place]) 07/31/2014   Non-insulin treated type 2 diabetes mellitus ([place]) 11/04/2013   Dyslipidemia 10/25/2013   Contraceptive management 10/25/2013   Hypothyroidism 10/05/2012   Hypertension 10/05/2012   Morbid obesity ([place]) 10/05/2012    Past Surgical History:  Procedure Laterality Date   CESAREAN SECTION     GALLBLADDER SURGERY      OB History     Gravida  1   Para  1   Term  1   Preterm      AB      Living  1      SAB      IAB      Ectopic      Multiple      Live Births  1            Home Medications    Prior to  Admission medications   Medication Sig Start Date End Date Taking? Authorizing Provider  cyclobenzaprine (FLEXERIL) 10 MG tablet Take 1 tablet (10 mg total) by mouth 2 (two) times daily as needed for muscle spasms. 01/04/22  Yes Jazmyne Beauchesne-Warren, Alda Lea, NP  predniSONE (DELTASONE) 20 MG tablet Take 1 tablet (20 mg total) by mouth daily with breakfast for 5 days. 01/04/22 01/09/22 Yes Jalana Moore-Warren, Alda Lea, NP  Semaglutide (OZEMPIC, 1 MG/DOSE, [place]) Inject 1 1e11 Vector [employer] into the skin once. Once a week   Yes [provider]  allopurinol (ZYLOPRIM) 100 MG tablet Take 1 tablet (100 mg total) by mouth daily. 09/25/19   Wallene Huh, DPM  ALPRAZolam (XANAX) 0.25 MG tablet TAKE 1 TABLET(0.25 MG) BY MOUTH TWICE DAILY AS NEEDED FOR ANXIETY 07/29/19   Estill Dooms, NP  Cholecalciferol 125 MCG (5000 UT) capsule Take 1 capsule  (5,000 Units total) by mouth daily. 03/25/19   Estill Dooms, NP  colchicine 0.6 MG tablet Take by mouth. Patient not taking: Reported on 04/07/2020    [provider]  levothyroxine (SYNTHROID) 200 MCG tablet TAKE 1 TABLET(200 MCG) BY MOUTH DAILY BEFORE AND BREAKFAST 05/10/21   Estill Dooms, NP  levothyroxine (SYNTHROID) 200 MCG tablet TAKE 1 TABLET(200 MCG) BY MOUTH DAILY BEFORE BREAKFAST 11/05/21   Estill Dooms, NP  levothyroxine (SYNTHROID) 25 MCG tablet TAKE 1 TABLET BY MOUTH DAILY WITH 200 MCG 09/30/21   Estill Dooms, NP  metFORMIN (GLUCOPHAGE) 1000 MG tablet TAKE 1 TABLET BY MOUTH TWICE DAILY WITH MEAL 05/03/21   Estill Dooms, NP  metoprolol succinate (TOPROL-XL) 50 MG 24 hr tablet Take 25 mg by mouth 2 (two) times daily. 02/28/19   [provider]  simvastatin (ZOCOR) 20 MG tablet Take 1 tablet (20 mg total) by mouth daily. 02/02/21   Estill Dooms, NP  VYFEMLA 0.4-35 MG-MCG tablet TAKE 1 TABLET BY MOUTH EVERY DAY CONTINUOUSLY 11/29/21   Estill Dooms, NP    Family History Family History  Problem Relation Age of Onset   Hypertension Father    Diabetes Brother    Cancer Maternal Aunt        vaginal   Stroke Paternal Aunt    Cancer Maternal Grandmother        colon   CAD Paternal Grandfather    Stroke Paternal Grandmother    Cancer Maternal Grandfather        colon   Diabetes Mother    Other Brother        was shot    Social History Social History   Tobacco Use   Smoking status: Never   Smokeless tobacco: Never  Vaping Use   Vaping Use: Never used  Substance Use Topics   Alcohol use: Yes    Comment: occassional   Drug use: No     Allergies   Celexa [citalopram hydrobromide]   Review of Systems Review of Systems Per HPI  Physical Exam Triage Vital Signs ED Triage Vitals  Enc Vitals Group     BP 01/04/22 1127 (!) 145/76     Pulse Rate 01/04/22 1127 82     Resp 01/04/22 1127 16     Temp 01/04/22  1127 98.1 F (36.7 C)     Temp Source 01/04/22 1127 Oral     SpO2 01/04/22 1127 98 %     Weight --      Height --      Head Circumference --  Peak Flow --      Pain Score 01/04/22 1129 5     Pain Loc --      Pain Edu? --      Excl. in [place]? --    No data found.  Updated Vital Signs BP (!) 145/76 (BP Location: Right Arm)   Pulse 82   Temp 98.1 F (36.7 C) (Oral)   Resp 16   SpO2 98%   Visual Acuity Right Eye Distance:   Left Eye Distance:   Bilateral Distance:    Right Eye Near:   Left Eye Near:    Bilateral Near:     Physical Exam Vitals and nursing note reviewed.  Constitutional:      General: She is not in acute distress.    Appearance: Normal appearance.  HENT:     Head: Normocephalic.  Eyes:     Extraocular Movements: Extraocular movements intact.     Pupils: Pupils are equal, round, and reactive to light.  Pulmonary:     Effort: Pulmonary effort is normal.  Musculoskeletal:     Cervical back: Normal range of motion.     Left foot: Normal range of motion and normal capillary refill. Swelling and tenderness present. No deformity. Normal pulse.     Comments: Tenderness noted to the first metatarsal of the left foot.  There is mild swelling noted to the left great toe.  There is no obvious deformity or ecchymosis present.  Lymphadenopathy:     Cervical: No cervical adenopathy.  Skin:    General: Skin is warm and dry.  Neurological:     General: No focal deficit present.     Mental Status: She is alert and oriented to person, place, and time.  Psychiatric:        Mood and Affect: Mood normal.        Behavior: Behavior normal.      UC Treatments / Results  Labs (all labs ordered are listed, but only abnormal results are displayed) Labs Reviewed - No data to display  EKG   Radiology DG Foot Complete Left  Result Date: 01/04/2022 CLINICAL DATA:  Left foot pain post fall. EXAM: LEFT FOOT - COMPLETE 3+ VIEW COMPARISON:  Sep 05, 2020 FINDINGS:  There is no evidence of fracture or dislocation. 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 Calcaneal spurs. IMPRESSION: 1. No acute fracture or dislocation identified about the left foot. 2. 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 Electronically Signed   By: Fidela Salisbury M.D.   On: 01/04/2022 11:47    Procedures Procedures (including critical care time)  Medications Ordered in UC Medications  ketorolac (TORADOL) 30 MG/ML injection 30 mg (30 mg Intramuscular Given 01/04/22 1216)    Initial Impression / Assessment and Plan / UC Course  I have reviewed the triage vital signs and the nursing notes.  Pertinent labs & imaging results that were available during my care of the patient were reviewed by me and considered in my medical decision making (see chart for details).  Patient presents for complaints of left foot pain.  On exam, she has swelling to the first meta tarsal of the left foot.  There is no obvious ecchymosis, erythema, or bruising present.  Area is tender to palpation.  Patient states that she had the same or similar symptoms prior.  Some patients symptoms, will treat her left foot sprain with Toradol, cyclobenzaprine, and prednisone as this appeared to work for her prior.  Discussed  with patient that if this regimen does not improve her symptoms, will recommend that she follow-up with orthopedics for further evaluation.  Patient was in agreement with this plan of care.  Supportive care recommendations were provided to the patient.  Patient verbalizes understanding.  All questions were answered. Final Clinical Impressions(s) / UC Diagnoses   Final diagnoses:  Left foot pain  Foot sprain, left, initial encounter     Discharge Instructions      Take medication as prescribed. May take over-the-counter Tylenol arthritis strength for any breakthrough pain or discomfort. Weightbearing as tolerated. Gentle  stretching and range of motion exercises while symptoms persist. Monitor your blood glucose levels while taking the prednisone. As discussed, if your symptoms fail to improve after this medication regimen, recommend following up with orthopedics.  You can follow-up with [facility] at [phone] or [facility] at [phone]. Follow-up as needed.     ED Prescriptions     Medication Sig Dispense Auth. Provider   predniSONE (DELTASONE) 20 MG tablet Take 1 tablet (20 mg total) by mouth daily with breakfast for 5 days. 5 tablet Armina Galloway-Warren, Alda Lea, NP   cyclobenzaprine (FLEXERIL) 10 MG tablet Take 1 tablet (10 mg total) by mouth 2 (two) times daily as needed for muscle spasms. 20 tablet Elizeo Rodriques-Warren, Alda Lea, NP      PDMP not reviewed this encounter.   Tish Men, NP 01/04/22 1317

## 2022-01-04 NOTE — Discharge Instructions (Addendum)
Take medication as prescribed. May take over-the-counter Tylenol arthritis strength for any breakthrough pain or discomfort. Weightbearing as tolerated. Gentle stretching and range of motion exercises while symptoms persist. Monitor your blood glucose levels while taking the prednisone. As discussed, if your symptoms fail to improve after this medication regimen, recommend following up with orthopedics.  You can follow-up with [facility] [place] at [phone] or [facility] at [phone]. Follow-up as needed.

## 2022-01-04 NOTE — ED Triage Notes (Signed)
Left foot pain.  Lost balance last Wednesday and fell.  States foot cramps and feels like when she pulled the tendon in that same foot last year.   Has been applying ice and using ibuprofen.  States using an ace wrap makes it hurt worse.

## 2022-02-25 DIAGNOSIS — E782 Mixed hyperlipidemia: Secondary | ICD-10-CM | POA: Diagnosis not present

## 2022-02-25 DIAGNOSIS — Z0000 Encounter for general adult medical examination without abnormal findings: Secondary | ICD-10-CM | POA: Diagnosis not present

## 2022-02-25 DIAGNOSIS — E559 Vitamin D deficiency, unspecified: Secondary | ICD-10-CM | POA: Diagnosis not present

## 2022-02-25 DIAGNOSIS — E039 Hypothyroidism, unspecified: Secondary | ICD-10-CM | POA: Diagnosis not present

## 2022-02-25 DIAGNOSIS — E1165 Type 2 diabetes mellitus with hyperglycemia: Secondary | ICD-10-CM | POA: Diagnosis not present

## 2022-03-14 ENCOUNTER — Other Ambulatory Visit (HOSPITAL_COMMUNITY): Payer: Self-pay | Admitting: Adult Health

## 2022-03-14 DIAGNOSIS — Z1231 Encounter for screening mammogram for malignant neoplasm of breast: Secondary | ICD-10-CM

## 2022-03-18 ENCOUNTER — Ambulatory Visit (HOSPITAL_COMMUNITY)
Admission: RE | Admit: 2022-03-18 | Discharge: 2022-03-18 | Disposition: A | Discharge status: Home / Self Care | Payer: BC Managed Care – PPO | Source: Ambulatory Visit | Attending: Adult Health | Admitting: Adult Health

## 2022-03-18 ENCOUNTER — Encounter (HOSPITAL_COMMUNITY): Payer: Self-pay

## 2022-03-18 ENCOUNTER — Inpatient Hospital Stay (HOSPITAL_COMMUNITY): Admission: RE | Admit: 2022-03-18 | Payer: BC Managed Care – PPO | Source: Ambulatory Visit

## 2022-03-18 DIAGNOSIS — Z1231 Encounter for screening mammogram for malignant neoplasm of breast: Secondary | ICD-10-CM

## 2022-04-22 ENCOUNTER — Ambulatory Visit (INDEPENDENT_AMBULATORY_CARE_PROVIDER_SITE_OTHER): Payer: BC Managed Care – PPO | Admitting: Adult Health

## 2022-04-22 ENCOUNTER — Encounter: Payer: Self-pay | Admitting: Adult Health

## 2022-04-22 ENCOUNTER — Other Ambulatory Visit (HOSPITAL_COMMUNITY)
Admission: RE | Admit: 2022-04-22 | Discharge: 2022-04-22 | Disposition: A | Discharge status: Home / Self Care | Payer: BC Managed Care – PPO | Source: Ambulatory Visit | Attending: Adult Health | Admitting: Adult Health

## 2022-04-22 VITALS — BP 144/81 | HR 77 | Ht 72.0 in | Wt 367.6 lb

## 2022-04-22 DIAGNOSIS — R195 Other fecal abnormalities: Secondary | ICD-10-CM | POA: Diagnosis not present

## 2022-04-22 DIAGNOSIS — Z01419 Encounter for gynecological examination (general) (routine) without abnormal findings: Secondary | ICD-10-CM | POA: Diagnosis not present

## 2022-04-22 DIAGNOSIS — Z3041 Encounter for surveillance of contraceptive pills: Secondary | ICD-10-CM

## 2022-04-22 DIAGNOSIS — E039 Hypothyroidism, unspecified: Secondary | ICD-10-CM

## 2022-04-22 DIAGNOSIS — Z1212 Encounter for screening for malignant neoplasm of rectum: Secondary | ICD-10-CM

## 2022-04-22 DIAGNOSIS — I10 Essential (primary) hypertension: Secondary | ICD-10-CM | POA: Diagnosis not present

## 2022-04-22 DIAGNOSIS — E119 Type 2 diabetes mellitus without complications: Secondary | ICD-10-CM

## 2022-04-22 DIAGNOSIS — Z1211 Encounter for screening for malignant neoplasm of colon: Secondary | ICD-10-CM

## 2022-04-22 LAB — HEMOCCULT GUIAC POC 1CARD (OFFICE): Fecal Occult Blood, POC: POSITIVE — AB

## 2022-04-22 MED ORDER — VYFEMLA 0.4-35 MG-MCG PO TABS: ORAL_TABLET | ORAL | 4 refills | Status: DC

## 2022-04-22 NOTE — Progress Notes (Signed)
Patient ID: Natalie Hernandez, female   DOB: 1970-08-07, 52 y.o.   MRN: [id number] History of Present Illness: Natalie Hernandez is a 52 year old white female, married, G1P1001, in for a well woman gyn exam and pap. She has had some bleeding on birth control pills, usually does not have a period. She has lost weight on ozempic and A1c is good. She is still working.   PCP is Pablo Lawrence NP  Current Medications, Allergies, Past Medical History, Past Surgical History, Family History and Social History were reviewed in [employer] record.     Review of Systems: Patient denies any headaches, hearing loss, fatigue, blurred vision, shortness of breath, chest pain, abdominal pain, problems with bowel movements, urination, or intercourse. No joint pain or mood swings.  See HPI for positives   Physical Exam:BP (!) 144/81 (BP Location: Right Arm, Patient Position: Sitting, Cuff Size: Normal)   Pulse 77   Ht 6' (1.829 m)   Wt (!) 367 lb 9.6 oz (166.7 kg)   BMI 49.86 kg/m   General:  Well developed, well nourished, no acute distress Skin:  Warm and dry Neck:  Midline trachea, normal thyroid, good ROM, no lymphadenopathy Lungs; Clear to auscultation bilaterally Breast:  No dominant palpable mass, retraction, or nipple discharge Cardiovascular: Regular rate and rhythm Abdomen:  Soft, non tender, no hepatosplenomegaly Pelvic:  External genitalia is normal in appearance, no lesions.  The vagina is normal in appearance. Urethra has no lesions or masses. The cervix is bulbous, pap with HR HPV genotyping performed.Marland Kitchen  Uterus is felt to be normal size, shape, and contour.  No adnexal masses or tenderness noted.Bladder is non tender, no masses felt. Rectal: Good sphincter tone, no polyps, or hemorrhoids felt.  Hemoccult positive Extremities/musculoskeletal:  No swelling or varicosities noted, no clubbing or cyanosis Psych:  No mood changes, alert and cooperative,seems happy AA is 1 Fall risk is  low    04/22/2022    8:43 AM 04/08/2021   10:24 AM 04/07/2020    2:58 PM  Depression screen PHQ 2/9  Decreased Interest 0 0 0  Down, Depressed, Hopeless 0 0 1  PHQ - 2 Score 0 0 1  Altered sleeping 0 1 1  Tired, decreased energy '1 1 1  '$ Change in appetite 0 1 1  Feeling bad or failure about yourself  0 0 1  Trouble concentrating 0 0 0  Moving slowly or fidgety/restless 0 0 0  Suicidal thoughts 0 0 0  PHQ-9 Score '1 3 5       '$ 04/22/2022    8:43 AM 04/08/2021   10:24 AM 04/07/2020    2:58 PM  GAD 7 : Generalized Anxiety Score  Nervous, Anxious, on Edge '1 1 1  '$ Control/stop worrying 0 0 1  Worry too much - different things 0 1 1  Trouble relaxing 0 0 1  Restless 0 0 0  Easily annoyed or irritable 0 0 0  Afraid - awful might happen 0 0 0  Total GAD 7 Score '1 2 4      '$ Upstream - 04/22/22 4431       Pregnancy Intention Screening   Does the patient want to become pregnant in the next year? No    Does the patient's partner want to become pregnant in the next year? No    Would the patient like to discuss contraceptive options today? No      Contraception Wrap Up   Current Method Oral Contraceptive  End Method Oral Contraceptive    Contraception Counseling Provided No             Examination chaperoned by Levy Pupa LPN  Impression and Plan: 1. Encounter for gynecological examination with Papanicolaou smear of cervix Pap sent Pap in 3 years if normal GYN physical in 1 year Mammogram negative 03/18/22 Labs with PCP  2. Encounter for screening fecal occult blood testing Hemoccult was positive  3. Positive fecal occult blood test Refer to Dr Abbey Chatters for colonoscopy   4. Essential hypertension On meds with PCP  5. Diabetes mellitus without complication ([place]) On meds with PCP  6. Hypothyroidism, unspecified type On meds with PCP  7. Screening for colorectal cancer Referred to Dr Abbey Chatters for colonoscopy   8. Encounter for surveillance of contraceptive  pills Will continue birth control pills, will stop for 1 month a age 57 and check [place]  Meds ordered this encounter  Medications   norethindrone-ethinyl estradiol (VYFEMLA) 0.4-35 MG-MCG tablet    Sig: TAKE 1 TABLET BY MOUTH EVERY DAY CONTINUOUSLY    Dispense:  112 tablet    Refill:  4    Order Specific Question:   Supervising Provider    Answer:   Tania Ade H [[id number]]

## 2022-04-25 ENCOUNTER — Encounter: Payer: Self-pay | Admitting: Gastroenterology

## 2022-04-26 LAB — CYTOLOGY - PAP
Comment: NEGATIVE
Diagnosis: UNDETERMINED — AB
High risk HPV: NEGATIVE

## 2022-04-27 ENCOUNTER — Other Ambulatory Visit: Payer: Self-pay | Admitting: Adult Health

## 2022-04-27 ENCOUNTER — Encounter: Payer: Self-pay | Admitting: Adult Health

## 2022-04-27 DIAGNOSIS — R87610 Atypical squamous cells of undetermined significance on cytologic smear of cervix (ASC-US): Secondary | ICD-10-CM | POA: Insufficient documentation

## 2022-04-27 MED ORDER — METRONIDAZOLE 500 MG PO TABS: 500.0000 mg | ORAL_TABLET | Freq: Two times a day (BID) | ORAL | 0 refills | Status: DC

## 2022-05-10 ENCOUNTER — Encounter: Payer: Self-pay | Admitting: Gastroenterology

## 2022-05-10 ENCOUNTER — Ambulatory Visit (INDEPENDENT_AMBULATORY_CARE_PROVIDER_SITE_OTHER): Payer: BC Managed Care – PPO | Admitting: Gastroenterology

## 2022-05-10 VITALS — BP 140/80 | HR 76 | Temp 97.4°F | Ht 72.0 in | Wt 369.8 lb

## 2022-05-10 DIAGNOSIS — Z1211 Encounter for screening for malignant neoplasm of colon: Secondary | ICD-10-CM | POA: Diagnosis not present

## 2022-05-10 DIAGNOSIS — R195 Other fecal abnormalities: Secondary | ICD-10-CM

## 2022-05-10 NOTE — H&P (View-Only) (Signed)
GI Office Note    Referring Provider: Estill Dooms, NP Primary Care Physician:  Pablo Lawrence, NP  Primary Gastroenterologist: Cristopher Estimable.Rourk, MD  Chief Complaint   Chief Complaint  Patient presents with   Colonoscopy    Colonoscopy screening.      History of Present Illness   Natalie Hernandez is a 51 y.o. female presenting today at the request of Estill Dooms, NP for positive FIT test.   Per review of chart, negative fecal occult blood test yearly up until 2 weeks ago with positive fecal occult blood and 1 card on 04/22/22.   No prior colonoscopy on file.  States GYN told her she does not have hemorrhoids   Has had chronic diarrhea and minor blood with stools that are more normal. Has ben like this since 2000. Has a solid stool once per month. If anxiety is high then she has worse loose stools. Stools are majority loose/mushy. Occasional watery stools with yellow tinge. Denis any abdominal pain. Does not take anything for the loose stools. Stools usually occur 30 minutes after she eats. If she eats orange juice or sausage biscuit  she has to use the bathroom within 30 minutes. She has been told by a dietician that is not true. No issues with appetite.   No reflux, N/V, lack of appetite. Came off Ozempic and switched to [facility] and has lack of appetite from that. From April her A1c was 7.9 and then dropped to 5.8. Has lost 58 lbs no it. No side effects since starting on either medication.   Sometimes after colchicine she may notice a little bit but that is because she is raw in her perineum.   On birth control - takes it continuously so no cycles. Monitoring hormone levels to assess for menopause.   Last year unable to get a good poop to submit for Cologuard. Last dose metronidazole last week.   Metformin twice daily.     Current Outpatient Medications  Medication Sig Dispense Refill   allopurinol (ZYLOPRIM) 100 MG tablet Take by mouth.     ALPRAZolam  (XANAX) 0.25 MG tablet TAKE 1 TABLET(0.25 MG) BY MOUTH TWICE DAILY AS NEEDED FOR ANXIETY 60 tablet 0   colchicine 0.6 MG tablet Take by mouth.     INDOMETHACIN PO      levothyroxine (SYNTHROID) 200 MCG tablet TAKE 1 TABLET(200 MCG) BY MOUTH DAILY BEFORE BREAKFAST 30 tablet 6   metFORMIN (GLUCOPHAGE) 1000 MG tablet TAKE 1 TABLET BY MOUTH TWICE DAILY WITH MEAL 180 tablet 3   metoprolol succinate (TOPROL-XL) 50 MG 24 hr tablet Take 2 tablets by mouth daily.     metroNIDAZOLE (FLAGYL) 500 MG tablet Take 1 tablet (500 mg total) by mouth 2 (two) times daily. 14 tablet 0   norethindrone-ethinyl estradiol (VYFEMLA) 0.4-35 MG-MCG tablet TAKE 1 TABLET BY MOUTH EVERY DAY CONTINUOUSLY 112 tablet 4   simvastatin (ZOCOR) 20 MG tablet Take 1 tablet (20 mg total) by mouth daily. 90 tablet 2   tirzepatide (MOUNJARO) 5 MG/0.5ML Pen Inject into the skin.     cetirizine (ZYRTEC) 10 MG tablet  (Patient not taking: Reported on 05/10/2022)     Cholecalciferol 125 MCG (5000 UT) capsule Take 1 capsule (5,000 Units total) by mouth daily. (Patient not taking: Reported on 05/10/2022)     Semaglutide (OZEMPIC, 1 MG/DOSE, [place]) Inject 1 1e11 Vector Genomes into the skin once. Once a week (Patient not taking: Reported on 05/10/2022)     No  current facility-administered medications for this visit.    Past Medical History:  Diagnosis Date   Anxiety and depression 07/01/2015   Contraceptive management 10/25/2013   Diabetes ([place]) 11/04/2013   Dyslipidemia 10/25/2013   Gout    Headache(784.0)    menstral migraines   Hyperlipidemia    Hypertension    Hypothyroidism    Obesity    Vitamin D deficiency 01/05/2016   Vitamin D deficiency disease     Past Surgical History:  Procedure Laterality Date   CESAREAN SECTION     GALLBLADDER SURGERY      Family History  Problem Relation Age of Onset   Hypertension Father    Diabetes Brother    Cancer Maternal Aunt        vaginal   Stroke Paternal Aunt    Cancer Maternal  Grandmother        colon   CAD Paternal Grandfather    Stroke Paternal Grandmother    Cancer Maternal Grandfather        colon   Diabetes Mother    Other Brother        was shot    Allergies as of 05/10/2022 - Review Complete 04/22/2022  Allergen Reaction Noted   Celexa [citalopram hydrobromide] Rash 08/31/2015    Social History   Socioeconomic History   Marital status: Married    Spouse name: Not on file   Number of children: Not on file   Years of education: Not on file   Highest education level: Not on file  Occupational History   Not on file  Tobacco Use   Smoking status: Never   Smokeless tobacco: Never  Vaping Use   Vaping Use: Never used  Substance and Sexual Activity   Alcohol use: Yes    Comment: occassional   Drug use: No   Sexual activity: Yes    Birth control/protection: Pill  Other Topics Concern   Not on file  Social History Narrative   Not on file   Social Determinants of Health   Financial Resource Strain: Low Risk  (04/22/2022)   Overall Financial Resource Strain (CARDIA)    Difficulty of Paying Living Expenses: Not hard at all  Food Insecurity: No Food Insecurity (04/22/2022)   Hunger Vital Sign    Worried About Running Out of Food in the Last Year: Never true    [place] in the Last Year: Never true  Transportation Needs: No Transportation Needs (04/22/2022)   PRAPARE - Hydrologist (Medical): No    Lack of Transportation (Non-Medical): No  Physical Activity: Inactive (04/22/2022)   Exercise Vital Sign    Days of Exercise per Week: 0 days    Minutes of Exercise per Session: 0 min  Stress: No Stress Concern Present (04/22/2022)   [place]    Feeling of Stress : Only a little  Social Connections: Moderately Isolated (04/22/2022)   Social Connection and Isolation Panel [NHANES]    Frequency of Communication with Friends and Family: Three  times a week    Frequency of Social Gatherings with Friends and Family: Once a week    Attends Religious Services: Never    Marine scientist or Organizations: No    Attends Archivist Meetings: Never    Marital Status: Married  Human resources officer Violence: Not At Risk (04/22/2022)   Humiliation, Afraid, Rape, and Kick questionnaire    Fear of Current or  Ex-Partner: No    Emotionally Abused: No    Physically Abused: No    Sexually Abused: No     Review of Systems   Gen: Denies any fever, chills, fatigue, weight loss, lack of appetite.  CV: Denies chest pain, heart palpitations, peripheral edema, syncope.  Resp: Denies shortness of breath at rest or with exertion. Denies wheezing or cough.  GI: see HPI GU : Denies urinary burning, urinary frequency, urinary hesitancy MS: Denies joint pain, muscle weakness, cramps, or limitation of movement.  Derm: Denies rash, itching, dry skin Psych: Denies depression, anxiety, memory loss, and confusion Heme: Denies bruising, bleeding, and enlarged lymph nodes.   Physical Exam   BP (!) 140/80 (BP Location: Left Arm, Patient Position: Sitting, Cuff Size: Large)   Pulse 76   Temp (!) 97.4 F (36.3 C) (Temporal)   Ht 6' (1.829 m)   Wt (!) 369 lb 12.8 oz (167.7 kg)   SpO2 96%   BMI 50.15 kg/m   General:   Alert and oriented. Pleasant and cooperative. Well-nourished and well-developed.  Head:  Normocephalic and atraumatic. Eyes:  Without icterus, sclera clear and conjunctiva pink.  Ears:  Normal auditory acuity. Mouth:  No deformity or lesions, oral mucosa pink.  Lungs:  Clear to auscultation bilaterally. No wheezes, rales, or rhonchi. No distress.  Heart:  S1, S2 present without murmurs appreciated.  Abdomen:  +BS, soft, non-tender and non-distended. No HSM noted. No guarding or rebound. No masses appreciated.  Rectal:  Deferred  Msk:  Symmetrical without gross deformities. Normal posture. Extremities:  Without  edema. Neurologic:  Alert and  oriented x4;  grossly normal neurologically. Skin:  Intact without significant lesions or rashes. Psych:  Alert and cooperative. Normal mood and affect.   Assessment   Natalie Hernandez is a 52 y.o. female with a history of dyslipidemia, diabetes, HTN, hypothyroidism, Vitamin D deficiency  presenting today for positive FIT testing.   Positive FIT test: Fit test positive on 04/22/2022.  Performed with OB/GYN exam.  Previously tried to perform Cologuard however not able to get sufficient sample for testing.  Typically has chronic looser stools as stated below.  Has had some occasional intermittent rectal bleeding with more formed stools and some irritation with frequent wiping however denies any overt bleeding.  No prior colonoscopy.  No alarm symptoms present.  Denies any family history of colon cancer or colon polyps.  Will proceed with first-ever colonoscopy due to positive fit test.  We discussed necessary medication adjustments.  Chronic loose stools: S/p cholecystectomy.  Typically has multiple loose stools per day, may have a more normal/solid stool about once per month.  Loose stools can definitely worsen with anxiety.  At times depending on diet she may have fecal urgency associated with diarrhea.  This is likely bile acid diarrhea in the setting of cholecystectomy.  I have offered possible colestipol usage however patient would like to see if Darcel Bayley helps her with diarrhea given typical side effect of constipation.  If ongoing diarrhea despite long-term use of Mounjaro we may consider adding colestipol.  PLAN    Proceed with colonoscopy with propofol by Dr. Gala Romney in near future: the risks, benefits, and alternatives have been discussed with the patient in detail. The patient states understanding and desires to proceed. ASA 3 Hold Mounjaro for 7 days Hold Metformin night prior to and morning of May consider adding colestipol if frequent loose stools  continue, will await to see how Mounjaro effects stools.  Follow-up 2  to 3 months post procedure.   Venetia Night, MSN, FNP-BC, AGACNP-BC [facility]

## 2022-05-10 NOTE — Progress Notes (Signed)
  GI Office Note    Referring Provider: Griffin, Jennifer A, NP Primary Care Physician:  Keatts, Alysse Rathe, NP  Primary Gastroenterologist: Robert M.Rourk, MD  Chief Complaint   Chief Complaint  Patient presents with   Colonoscopy    Colonoscopy screening.      History of Present Illness   Natalie Hernandez is a 51 y.o. female presenting today at the request of Griffin, Jennifer A, NP for positive FIT test.   Per review of chart, negative fecal occult blood test yearly up until 2 weeks ago with positive fecal occult blood and 1 card on 04/22/22.   No prior colonoscopy on file.  States GYN told her she does not have hemorrhoids   Has had chronic diarrhea and minor blood with stools that are more normal. Has ben like this since 2000. Has a solid stool once per month. If anxiety is high then she has worse loose stools. Stools are majority loose/mushy. Occasional watery stools with yellow tinge. Denis any abdominal pain. Does not take anything for the loose stools. Stools usually occur 30 minutes after she eats. If she eats orange juice or sausage biscuit  she has to use the bathroom within 30 minutes. She has been told by a dietician that is not true. No issues with appetite.   No reflux, N/V, lack of appetite. Came off Ozempic and switched to Mounjaro and has lack of appetite from that. From April her A1c was 7.9 and then dropped to 5.8. Has lost 58 lbs no it. No side effects since starting on either medication.   Sometimes after colchicine she may notice a little bit but that is because she is raw in her perineum.   On birth control - takes it continuously so no cycles. Monitoring hormone levels to assess for menopause.   Last year unable to get a good poop to submit for Cologuard. Last dose metronidazole last week.   Metformin twice daily.     Current Outpatient Medications  Medication Sig Dispense Refill   allopurinol (ZYLOPRIM) 100 MG tablet Take by mouth.     ALPRAZolam  (XANAX) 0.25 MG tablet TAKE 1 TABLET(0.25 MG) BY MOUTH TWICE DAILY AS NEEDED FOR ANXIETY 60 tablet 0   colchicine 0.6 MG tablet Take by mouth.     INDOMETHACIN PO      levothyroxine (SYNTHROID) 200 MCG tablet TAKE 1 TABLET(200 MCG) BY MOUTH DAILY BEFORE BREAKFAST 30 tablet 6   metFORMIN (GLUCOPHAGE) 1000 MG tablet TAKE 1 TABLET BY MOUTH TWICE DAILY WITH MEAL 180 tablet 3   metoprolol succinate (TOPROL-XL) 50 MG 24 hr tablet Take 2 tablets by mouth daily.     metroNIDAZOLE (FLAGYL) 500 MG tablet Take 1 tablet (500 mg total) by mouth 2 (two) times daily. 14 tablet 0   norethindrone-ethinyl estradiol (VYFEMLA) 0.4-35 MG-MCG tablet TAKE 1 TABLET BY MOUTH EVERY DAY CONTINUOUSLY 112 tablet 4   simvastatin (ZOCOR) 20 MG tablet Take 1 tablet (20 mg total) by mouth daily. 90 tablet 2   tirzepatide (MOUNJARO) 5 MG/0.5ML Pen Inject into the skin.     cetirizine (ZYRTEC) 10 MG tablet  (Patient not taking: Reported on 05/10/2022)     Cholecalciferol 125 MCG (5000 UT) capsule Take 1 capsule (5,000 Units total) by mouth daily. (Patient not taking: Reported on 05/10/2022)     Semaglutide (OZEMPIC, 1 MG/DOSE, [place]) Inject 1 1e11 Vector Genomes into the skin once. Once a week (Patient not taking: Reported on 05/10/2022)     No   current facility-administered medications for this visit.    Past Medical History:  Diagnosis Date   Anxiety and depression 07/01/2015   Contraceptive management 10/25/2013   Diabetes ([facility]) 11/04/2013   Dyslipidemia 10/25/2013   Gout    Headache(784.0)    menstral migraines   Hyperlipidemia    Hypertension    Hypothyroidism    Obesity    Vitamin D deficiency 01/05/2016   Vitamin D deficiency disease     Past Surgical History:  Procedure Laterality Date   CESAREAN SECTION     GALLBLADDER SURGERY      Family History  Problem Relation Age of Onset   Hypertension Father    Diabetes Brother    Cancer Maternal Aunt        vaginal   Stroke Paternal Aunt    Cancer Maternal  Grandmother        colon   CAD Paternal Grandfather    Stroke Paternal Grandmother    Cancer Maternal Grandfather        colon   Diabetes Mother    Other Brother        was shot    Allergies as of 05/10/2022 - Review Complete 04/22/2022  Allergen Reaction Noted   Celexa [citalopram hydrobromide] Rash 08/31/2015    Social History   Socioeconomic History   Marital status: Married    Spouse name: Not on file   Number of children: Not on file   Years of education: Not on file   Highest education level: Not on file  Occupational History   Not on file  Tobacco Use   Smoking status: Never   Smokeless tobacco: Never  Vaping Use   Vaping Use: Never used  Substance and Sexual Activity   Alcohol use: Yes    Comment: occassional   Drug use: No   Sexual activity: Yes    Birth control/protection: Pill  Other Topics Concern   Not on file  Social History Narrative   Not on file   Social Determinants of Health   Financial Resource Strain: Low Risk  (04/22/2022)   Overall Financial Resource Strain (CARDIA)    Difficulty of Paying Living Expenses: Not hard at all  Food Insecurity: No Food Insecurity (04/22/2022)   Hunger Vital Sign    Worried About Running Out of Food in the Last Year: Never true    Ran Out of Food in the Last Year: Never true  Transportation Needs: No Transportation Needs (04/22/2022)   PRAPARE - Transportation    Lack of Transportation (Medical): No    Lack of Transportation (Non-Medical): No  Physical Activity: Inactive (04/22/2022)   Exercise Vital Sign    Days of Exercise per Week: 0 days    Minutes of Exercise per Session: 0 min  Stress: No Stress Concern Present (04/22/2022)   [employer] - Occupational Stress Questionnaire    Feeling of Stress : Only a little  Social Connections: Moderately Isolated (04/22/2022)   Social Connection and Isolation Panel [NHANES]    Frequency of Communication with Friends and Family: Three  times a week    Frequency of Social Gatherings with Friends and Family: Once a week    Attends Religious Services: Never    Active Member of Clubs or Organizations: No    Attends Club or Organization Meetings: Never    Marital Status: Married  Intimate Partner Violence: Not At Risk (04/22/2022)   Humiliation, Afraid, Rape, and Kick questionnaire    Fear of Current or   Ex-Partner: No    Emotionally Abused: No    Physically Abused: No    Sexually Abused: No     Review of Systems   Gen: Denies any fever, chills, fatigue, weight loss, lack of appetite.  CV: Denies chest pain, heart palpitations, peripheral edema, syncope.  Resp: Denies shortness of breath at rest or with exertion. Denies wheezing or cough.  GI: see HPI GU : Denies urinary burning, urinary frequency, urinary hesitancy MS: Denies joint pain, muscle weakness, cramps, or limitation of movement.  Derm: Denies rash, itching, dry skin Psych: Denies depression, anxiety, memory loss, and confusion Heme: Denies bruising, bleeding, and enlarged lymph nodes.   Physical Exam   BP (!) 140/80 (BP Location: Left Arm, Patient Position: Sitting, Cuff Size: Large)   Pulse 76   Temp (!) 97.4 F (36.3 C) (Temporal)   Ht 6' (1.829 m)   Wt (!) 369 lb 12.8 oz (167.7 kg)   SpO2 96%   BMI 50.15 kg/m   General:   Alert and oriented. Pleasant and cooperative. Well-nourished and well-developed.  Head:  Normocephalic and atraumatic. Eyes:  Without icterus, sclera clear and conjunctiva pink.  Ears:  Normal auditory acuity. Mouth:  No deformity or lesions, oral mucosa pink.  Lungs:  Clear to auscultation bilaterally. No wheezes, rales, or rhonchi. No distress.  Heart:  S1, S2 present without murmurs appreciated.  Abdomen:  +BS, soft, non-tender and non-distended. No HSM noted. No guarding or rebound. No masses appreciated.  Rectal:  Deferred  Msk:  Symmetrical without gross deformities. Normal posture. Extremities:  Without  edema. Neurologic:  Alert and  oriented x4;  grossly normal neurologically. Skin:  Intact without significant lesions or rashes. Psych:  Alert and cooperative. Normal mood and affect.   Assessment   Natalie Hernandez is a 51 y.o. female with a history of dyslipidemia, diabetes, HTN, hypothyroidism, Vitamin D deficiency  presenting today for positive FIT testing.   Positive FIT test: Fit test positive on 04/22/2022.  Performed with OB/GYN exam.  Previously tried to perform Cologuard however not able to get sufficient sample for testing.  Typically has chronic looser stools as stated below.  Has had some occasional intermittent rectal bleeding with more formed stools and some irritation with frequent wiping however denies any overt bleeding.  No prior colonoscopy.  No alarm symptoms present.  Denies any family history of colon cancer or colon polyps.  Will proceed with first-ever colonoscopy due to positive fit test.  We discussed necessary medication adjustments.  Chronic loose stools: S/p cholecystectomy.  Typically has multiple loose stools per day, may have a more normal/solid stool about once per month.  Loose stools can definitely worsen with anxiety.  At times depending on diet she may have fecal urgency associated with diarrhea.  This is likely bile acid diarrhea in the setting of cholecystectomy.  I have offered possible colestipol usage however patient would like to see if Mounjaro helps her with diarrhea given typical side effect of constipation.  If ongoing diarrhea despite long-term use of Mounjaro we may consider adding colestipol.  PLAN    Proceed with colonoscopy with propofol by Dr. Rourk in near future: the risks, benefits, and alternatives have been discussed with the patient in detail. The patient states understanding and desires to proceed. ASA 3 Hold Mounjaro for 7 days Hold Metformin night prior to and morning of May consider adding colestipol if frequent loose stools  continue, will await to see how Mounjaro effects stools.  Follow-up 2   to 3 months post procedure.   Vestal Crandall, MSN, FNP-BC, AGACNP-BC [facility] 

## 2022-05-10 NOTE — Patient Instructions (Addendum)
We are scheduling you for colonoscopy in the near future with Dr. Gala Romney.  You will receive separate detailed written instructions regarding your prep.  Your prep solution will be sent to your pharmacy for you.  Continue to monitor for any constipation side effects regarding your Mounjaro.  If your diarrhea/loose stools continue we could try once daily colestipol and increase as needed as you may be having looser stools due to bile acid diarrhea in the setting of gallbladder removal.  It was a pleasure to see you today. I want to create trusting relationships with patients. If you receive a survey regarding your visit,  I greatly appreciate you taking time to fill this out on paper or through your MyChart. I value your feedback.  Venetia Night, MSN, FNP-BC, AGACNP-BC [facility]

## 2022-05-11 ENCOUNTER — Encounter: Payer: Self-pay | Admitting: *Deleted

## 2022-05-11 ENCOUNTER — Other Ambulatory Visit: Payer: Self-pay | Admitting: *Deleted

## 2022-05-11 ENCOUNTER — Telehealth: Payer: Self-pay | Admitting: *Deleted

## 2022-05-11 MED ORDER — PEG 3350-KCL-NA BICARB-NACL 420 G PO SOLR: 4000.0000 mL | Freq: Once | ORAL | 0 refills | Status: AC

## 2022-05-11 NOTE — Telephone Encounter (Signed)
Attempt to call pt, mailbox is full,unable to leave message  TCS s/Dr.Rourk ASA 3

## 2022-05-20 DIAGNOSIS — J0100 Acute maxillary sinusitis, unspecified: Secondary | ICD-10-CM | POA: Diagnosis not present

## 2022-05-21 DIAGNOSIS — Z6841 Body Mass Index (BMI) 40.0 and over, adult: Secondary | ICD-10-CM | POA: Diagnosis not present

## 2022-05-21 DIAGNOSIS — S61216A Laceration without foreign body of right little finger without damage to nail, initial encounter: Secondary | ICD-10-CM | POA: Diagnosis not present

## 2022-05-23 ENCOUNTER — Telehealth: Payer: Self-pay | Admitting: *Deleted

## 2022-05-23 NOTE — Telephone Encounter (Signed)
Pt informed of providers message. Verbalized understanding.

## 2022-05-23 NOTE — Telephone Encounter (Signed)
Pt was seen on in urgent care over the weekend and was placed on cephalexin 1 tablet BID x 7 days and Culturelle.  She has colonoscopy on 06/09/22 and wanted to make sure it was ok to take medication or does she need to stop it within a certain time period. Please advise. Thank you

## 2022-06-02 NOTE — Patient Instructions (Signed)
Natalie Hernandez  06/02/2022     @PREFPERIOPPHARMACY$ @   Your procedure is scheduled on  06/09/2022.   Report to [facility] at  [place].   Call this number if you have problems the morning of surgery:  [phone]  If you experience any cold or flu symptoms such as cough, fever, chills, shortness of breath, etc. between now and your scheduled surgery, please notify us at the above number.   Remember:  Follow the diet and prep instructions given to you by the office.        Your last dose of mounjaro should be 06/01/2022 or before.     Take these medicines the morning of surgery with A SIP OF WATER      allopurinol, xanax(if needed), levothyroxine, metoprolol.     Do not wear jewelry, make-up or nail polish.  Do not wear lotions, powders, or perfumes, or deodorant.  Do not shave 48 hours prior to surgery.  Men may shave face and neck.  Do not bring valuables to the hospital.  [facility] is not responsible for any belongings or valuables.  Contacts, dentures or bridgework may not be worn into surgery.  Leave your suitcase in the car.  After surgery it may be brought to your room.  For patients admitted to the hospital, discharge time will be determined by your treatment team.  Patients discharged the day of surgery will not be allowed to drive home and must have someone with them for 24 hours.    Special instructions:   DO NOT smoke tobacco or vape for 24 hours before your procedure.  Please read over the following fact sheets that you were given. Anesthesia Post-op Instructions and Care and Recovery After Surgery      Colonoscopy, Adult, Care After The following information offers guidance on how to care for yourself after your procedure. Your health care provider may also give you more specific instructions. If you have problems or questions, contact your health care provider. What can I expect after the procedure? After the procedure, it is common to  have: A small amount of blood in your stool for 24 hours after the procedure. Some gas. Mild cramping or bloating of your abdomen. Follow these instructions at home: Eating and drinking  Drink enough fluid to keep your urine pale yellow. Follow instructions from your health care provider about eating or drinking restrictions. Resume your normal diet as told by your health care provider. Avoid heavy or fried foods that are hard to digest. Activity Rest as told by your health care provider. Avoid sitting for a long time without moving. Get up to take short walks every 1-2 hours. This is important to improve blood flow and breathing. Ask for help if you feel weak or unsteady. Return to your normal activities as told by your health care provider. Ask your health care provider what activities are safe for you. Managing cramping and bloating  Try walking around when you have cramps or feel bloated. If directed, apply heat to your abdomen as told by your health care provider. Use the heat source that your health care provider recommends, such as a moist heat pack or a heating pad. Place a towel between your skin and the heat source. Leave the heat on for 20-30 minutes. Remove the heat if your skin turns bright red. This is especially important if you are unable to feel pain, heat, or cold. You have a greater risk  of getting burned. General instructions If you were given a sedative during the procedure, it can affect you for several hours. Do not drive or operate machinery until your health care provider says that it is safe. For the first 24 hours after the procedure: Do not sign important documents. Do not drink alcohol. Do your regular daily activities at a slower pace than normal. Eat soft foods that are easy to digest. Take over-the-counter and prescription medicines only as told by your health care provider. Keep all follow-up visits. This is important. Contact a health care provider  if: You have blood in your stool 2-3 days after the procedure. Get help right away if: You have more than a small spotting of blood in your stool. You have large blood clots in your stool. You have swelling of your abdomen. You have nausea or vomiting. You have a fever. You have increasing pain in your abdomen that is not relieved with medicine. These symptoms may be an emergency. Get help right away. Call 911. Do not wait to see if the symptoms will go away. Do not drive yourself to the hospital. Summary After the procedure, it is common to have a small amount of blood in your stool. You may also have mild cramping and bloating of your abdomen. If you were given a sedative during the procedure, it can affect you for several hours. Do not drive or operate machinery until your health care provider says that it is safe. Get help right away if you have a lot of blood in your stool, nausea or vomiting, a fever, or increased pain in your abdomen. This information is not intended to replace advice given to you by your health care provider. Make sure you discuss any questions you have with your health care provider. Document Revised: 11/18/2020 Document Reviewed: 11/18/2020 Elsevier Patient Education  [place] After The following information offers guidance on how to care for yourself after your procedure. Your health care provider may also give you more specific instructions. If you have problems or questions, contact your health care provider. What can I expect after the procedure? After the procedure, it is common to have: Tiredness. Little or no memory about what happened during or after the procedure. Impaired judgment when it comes to making decisions. Nausea or vomiting. Some trouble with balance. Follow these instructions at home: For the time period you were told by your health care provider:  Rest. Do not participate in activities where  you could fall or become injured. Do not drive or use machinery. Do not drink alcohol. Do not take sleeping pills or medicines that cause drowsiness. Do not make important decisions or sign legal documents. Do not take care of children on your own. Medicines Take over-the-counter and prescription medicines only as told by your health care provider. If you were prescribed antibiotics, take them as told by your health care provider. Do not stop using the antibiotic even if you start to feel better. Eating and drinking Follow instructions from your health care provider about what you may eat and drink. Drink enough fluid to keep your urine pale yellow. If you vomit: Drink clear fluids slowly and in small amounts as you are able. Clear fluids include water, ice chips, low-calorie sports drinks, and fruit juice that has water added to it (diluted fruit juice). Eat light and bland foods in small amounts as you are able. These foods include bananas, applesauce, rice, lean meats, toast, and  crackers. General instructions  Have a responsible adult stay with you for the time you are told. It is important to have someone help care for you until you are awake and alert. If you have sleep apnea, surgery and some medicines can increase your risk for breathing problems. Follow instructions from your health care provider about wearing your sleep device: When you are sleeping. This includes during daytime naps. While taking prescription pain medicines, sleeping medicines, or medicines that make you drowsy. Do not use any products that contain nicotine or tobacco. These products include cigarettes, chewing tobacco, and vaping devices, such as e-cigarettes. If you need help quitting, ask your health care provider. Contact a health care provider if: You feel nauseous or vomit every time you eat or drink. You feel light-headed. You are still sleepy or having trouble with balance after 24 hours. You get a  rash. You have a fever. You have redness or swelling around the IV site. Get help right away if: You have trouble breathing. You have new confusion after you get home. These symptoms may be an emergency. Get help right away. Call 911. Do not wait to see if the symptoms will go away. Do not drive yourself to the hospital. This information is not intended to replace advice given to you by your health care provider. Make sure you discuss any questions you have with your health care provider. Document Revised: 08/23/2021 Document Reviewed: 08/23/2021 Elsevier Patient Education  [place].

## 2022-06-03 ENCOUNTER — Encounter (HOSPITAL_COMMUNITY): Payer: Self-pay

## 2022-06-03 ENCOUNTER — Encounter (HOSPITAL_COMMUNITY)
Admission: RE | Admit: 2022-06-03 | Discharge: 2022-06-03 | Disposition: A | Discharge status: Home / Self Care | Payer: BC Managed Care – PPO | Source: Ambulatory Visit | Attending: Internal Medicine | Admitting: Internal Medicine

## 2022-06-03 DIAGNOSIS — Z01818 Encounter for other preprocedural examination: Secondary | ICD-10-CM | POA: Diagnosis not present

## 2022-06-03 DIAGNOSIS — Z6841 Body Mass Index (BMI) 40.0 and over, adult: Secondary | ICD-10-CM | POA: Insufficient documentation

## 2022-06-03 DIAGNOSIS — E119 Type 2 diabetes mellitus without complications: Secondary | ICD-10-CM | POA: Insufficient documentation

## 2022-06-03 DIAGNOSIS — I10 Essential (primary) hypertension: Secondary | ICD-10-CM | POA: Diagnosis not present

## 2022-06-03 HISTORY — DX: Personal history of urinary calculi: Z87.442

## 2022-06-03 LAB — BASIC METABOLIC PANEL
Anion gap: 7 (ref 5–15)
BUN: 10 mg/dL (ref 6–20)
CO2: 24 mmol/L (ref 22–32)
Calcium: 8.5 mg/dL — ABNORMAL LOW (ref 8.9–10.3)
Chloride: 104 mmol/L (ref 98–111)
Creatinine, Ser: 0.83 mg/dL (ref 0.44–1.00)
GFR, Estimated: >60 mL/min (ref >60)
Glucose, Bld: 99 mg/dL (ref 70–99)
Potassium: 3.8 mmol/L (ref 3.5–5.1)
Sodium: 135 mmol/L (ref 135–145)

## 2022-06-03 LAB — POCT PREGNANCY, URINE: Preg Test, Ur: NEGATIVE

## 2022-06-07 ENCOUNTER — Other Ambulatory Visit (HOSPITAL_COMMUNITY): Payer: BC Managed Care – PPO

## 2022-06-09 ENCOUNTER — Encounter (HOSPITAL_COMMUNITY)
Admission: RE | Disposition: A | Discharge status: Home / Self Care | Payer: Self-pay | Source: Home / Self Care | Attending: Internal Medicine

## 2022-06-09 ENCOUNTER — Ambulatory Visit (HOSPITAL_COMMUNITY): Payer: BC Managed Care – PPO | Admitting: Anesthesiology

## 2022-06-09 ENCOUNTER — Encounter (HOSPITAL_COMMUNITY): Payer: Self-pay | Admitting: Internal Medicine

## 2022-06-09 ENCOUNTER — Ambulatory Visit (HOSPITAL_COMMUNITY)
Admission: RE | Admit: 2022-06-09 | Discharge: 2022-06-09 | Disposition: A | Discharge status: Home / Self Care | Payer: BC Managed Care – PPO | Attending: Internal Medicine | Admitting: Internal Medicine

## 2022-06-09 DIAGNOSIS — K529 Noninfective gastroenteritis and colitis, unspecified: Secondary | ICD-10-CM | POA: Insufficient documentation

## 2022-06-09 DIAGNOSIS — E039 Hypothyroidism, unspecified: Secondary | ICD-10-CM | POA: Diagnosis not present

## 2022-06-09 DIAGNOSIS — M109 Gout, unspecified: Secondary | ICD-10-CM | POA: Diagnosis not present

## 2022-06-09 DIAGNOSIS — D124 Benign neoplasm of descending colon: Secondary | ICD-10-CM | POA: Insufficient documentation

## 2022-06-09 DIAGNOSIS — R519 Headache, unspecified: Secondary | ICD-10-CM | POA: Diagnosis not present

## 2022-06-09 DIAGNOSIS — I10 Essential (primary) hypertension: Secondary | ICD-10-CM | POA: Insufficient documentation

## 2022-06-09 DIAGNOSIS — K625 Hemorrhage of anus and rectum: Secondary | ICD-10-CM | POA: Diagnosis not present

## 2022-06-09 DIAGNOSIS — Z1211 Encounter for screening for malignant neoplasm of colon: Secondary | ICD-10-CM

## 2022-06-09 DIAGNOSIS — Z139 Encounter for screening, unspecified: Secondary | ICD-10-CM | POA: Diagnosis not present

## 2022-06-09 DIAGNOSIS — E119 Type 2 diabetes mellitus without complications: Secondary | ICD-10-CM | POA: Diagnosis not present

## 2022-06-09 DIAGNOSIS — Z7984 Long term (current) use of oral hypoglycemic drugs: Secondary | ICD-10-CM | POA: Insufficient documentation

## 2022-06-09 DIAGNOSIS — R195 Other fecal abnormalities: Secondary | ICD-10-CM | POA: Diagnosis not present

## 2022-06-09 DIAGNOSIS — Z6841 Body Mass Index (BMI) 40.0 and over, adult: Secondary | ICD-10-CM | POA: Diagnosis not present

## 2022-06-09 DIAGNOSIS — F32A Depression, unspecified: Secondary | ICD-10-CM | POA: Diagnosis not present

## 2022-06-09 DIAGNOSIS — F419 Anxiety disorder, unspecified: Secondary | ICD-10-CM | POA: Diagnosis not present

## 2022-06-09 DIAGNOSIS — M1990 Unspecified osteoarthritis, unspecified site: Secondary | ICD-10-CM | POA: Diagnosis not present

## 2022-06-09 HISTORY — PX: POLYPECTOMY: SHX5525

## 2022-06-09 HISTORY — PX: COLONOSCOPY WITH PROPOFOL: SHX5780

## 2022-06-09 LAB — GLUCOSE, CAPILLARY: Glucose-Capillary: 96 mg/dL (ref 70–99)

## 2022-06-09 SURGERY — COLONOSCOPY WITH PROPOFOL
Anesthesia: General

## 2022-06-09 MED ORDER — LIDOCAINE HCL (CARDIAC) PF 100 MG/5ML IV SOSY
PREFILLED_SYRINGE | INTRAVENOUS | Status: DC | PRN
  Administered 2022-06-09: 50 mg via INTRAVENOUS

## 2022-06-09 MED ORDER — LACTATED RINGERS IV SOLN
INTRAVENOUS | Status: DC
  Administered 2022-06-09: 1000 mL via INTRAVENOUS

## 2022-06-09 MED ORDER — PROPOFOL 10 MG/ML IV BOLUS
INTRAVENOUS | Status: DC | PRN
  Administered 2022-06-09: 50 mg via INTRAVENOUS
  Administered 2022-06-09: 100 mg via INTRAVENOUS

## 2022-06-09 MED ORDER — DEXMEDETOMIDINE HCL IN NACL 80 MCG/20ML IV SOLN
INTRAVENOUS | Status: DC | PRN
  Administered 2022-06-09: 8 ug via BUCCAL

## 2022-06-09 MED ORDER — PROPOFOL 500 MG/50ML IV EMUL
INTRAVENOUS | Status: DC | PRN
  Administered 2022-06-09: 150 ug/kg/min via INTRAVENOUS

## 2022-06-09 MED ORDER — PHENYLEPHRINE HCL (PRESSORS) 10 MG/ML IV SOLN
INTRAVENOUS | Status: DC | PRN
  Administered 2022-06-09: 100 ug via INTRAVENOUS

## 2022-06-09 NOTE — Transfer of Care (Signed)
Immediate Anesthesia Transfer of Care Note  Patient: Natalie Hernandez  Procedure(s) Performed: COLONOSCOPY WITH PROPOFOL POLYPECTOMY  Patient Location: Short Stay  Anesthesia Type:General  Level of Consciousness: awake  Airway & Oxygen Therapy: Patient Spontanous Breathing  Post-op Assessment: Report given to RN and Post -op Vital signs reviewed and stable  Post vital signs: Reviewed and stable  Last Vitals:  Vitals Value Taken Time  BP 114/57 06/09/22 1127  Temp 36.7 C 06/09/22 1127  Pulse 73 06/09/22 1127  Resp 16 06/09/22 1127  SpO2 98 % 06/09/22 1127    Last Pain:  Vitals:   06/09/22 1127  TempSrc: Oral  PainSc: 0-No pain      Patients Stated Pain Goal: 6 ([id number])  Complications: No notable events documented.

## 2022-06-09 NOTE — Addendum Note (Signed)
Addendum  created 06/09/22 1228 by Orlie Dakin, CRNA   Intraprocedure Event edited, Intraprocedure Staff edited

## 2022-06-09 NOTE — Anesthesia Preprocedure Evaluation (Signed)
Anesthesia Evaluation  Patient identified by MRN, date of birth, ID band Patient awake    Reviewed: Allergy & Precautions, H&P , NPO status , Patient's Chart, lab work & pertinent test results, reviewed documented beta blocker date and time   Airway Mallampati: II  TM Distance: >3 FB Neck ROM: Full    Dental  (+) Dental Advisory Given, Teeth Intact   Pulmonary neg pulmonary ROS   Pulmonary exam normal breath sounds clear to auscultation       Cardiovascular hypertension, Pt. on medications and Pt. on home beta blockers Normal cardiovascular exam Rhythm:Regular Rate:Normal     Neuro/Psych  Headaches PSYCHIATRIC DISORDERS Anxiety Depression       GI/Hepatic negative GI ROS, Neg liver ROS,,,  Endo/Other  diabetes (mounjaro - 05/27/22), Well Controlled, Type 2, Oral Hypoglycemic AgentsHypothyroidism  Morbid obesity  Renal/GU negative Renal ROS  negative genitourinary   Musculoskeletal  (+) Arthritis  (gout),    Abdominal   Peds negative pediatric ROS (+)  Hematology negative hematology ROS (+)   Anesthesia Other Findings   Reproductive/Obstetrics negative OB ROS                             Anesthesia Physical Anesthesia Plan  ASA: 3  Anesthesia Plan: General   Post-op Pain Management: Minimal or no pain anticipated   Induction:   PONV Risk Score and Plan: 1 and Propofol infusion  Airway Management Planned: Nasal Cannula, Natural Airway and Simple Face Mask  Additional Equipment:   Intra-op Plan:   Post-operative Plan:   Informed Consent: I have reviewed the patients History and Physical, chart, labs and discussed the procedure including the risks, benefits and alternatives for the proposed anesthesia with the patient or authorized representative who has indicated his/her understanding and acceptance.     Dental advisory given  Plan Discussed with: CRNA and  Surgeon  Anesthesia Plan Comments:        Anesthesia Quick Evaluation

## 2022-06-09 NOTE — Discharge Instructions (Signed)
  Colonoscopy Discharge Instructions  Read the instructions outlined below and refer to this sheet in the next few weeks. These discharge instructions provide you with general information on caring for yourself after you leave the hospital. Your doctor may also give you specific instructions. While your treatment has been planned according to the most current medical practices available, unavoidable complications occasionally occur. If you have any problems or questions after discharge, call Dr. Gala Romney at [phone]. ACTIVITY You may resume your regular activity, but move at a slower pace for the next 24 hours.  Take frequent rest periods for the next 24 hours.  Walking will help get rid of the air and reduce the bloated feeling in your belly (abdomen).  No driving for 24 hours (because of the medicine (anesthesia) used during the test).   Do not sign any important legal documents or operate any machinery for 24 hours (because of the anesthesia used during the test).  NUTRITION Drink plenty of fluids.  You may resume your normal diet as instructed by your doctor.  Begin with a light meal and progress to your normal diet. Heavy or fried foods are harder to digest and may make you feel sick to your stomach (nauseated).  Avoid alcoholic beverages for 24 hours or as instructed.  MEDICATIONS You may resume your normal medications unless your doctor tells you otherwise.  WHAT YOU CAN EXPECT TODAY Some feelings of bloating in the abdomen.  Passage of more gas than usual.  Spotting of blood in your stool or on the toilet paper.  IF YOU HAD POLYPS REMOVED DURING THE COLONOSCOPY: No aspirin products for 7 days or as instructed.  No alcohol for 7 days or as instructed.  Eat a soft diet for the next 24 hours.  FINDING OUT THE RESULTS OF YOUR TEST Not all test results are available during your visit. If your test results are not back during the visit, make an appointment with your caregiver to find out the  results. Do not assume everything is normal if you have not heard from your caregiver or the medical facility. It is important for you to follow up on all of your test results.  SEEK IMMEDIATE MEDICAL ATTENTION IF: You have more than a spotting of blood in your stool.  Your belly is swollen (abdominal distention).  You are nauseated or vomiting.  You have a temperature over 101.  You have abdominal pain or discomfort that is severe or gets worse throughout the day.      Large polyp removed from your colon.  Likely the source of blood in your stool   further recommendations to follow once pathology report is available for review   office visit with Venetia Night in 4 weeks   at patient request, called Carmelle Mueth at [phone] findings and recommendations

## 2022-06-09 NOTE — Anesthesia Postprocedure Evaluation (Signed)
Anesthesia Post Note  Patient: Natalie Hernandez  Procedure(s) Performed: COLONOSCOPY WITH PROPOFOL POLYPECTOMY  Patient location during evaluation: Phase II Anesthesia Type: General Level of consciousness: awake and alert and oriented Pain management: pain level controlled Vital Signs Assessment: post-procedure vital signs reviewed and stable Respiratory status: spontaneous breathing, nonlabored ventilation and respiratory function stable Cardiovascular status: blood pressure returned to baseline and stable Postop Assessment: no apparent nausea or vomiting Anesthetic complications: no  No notable events documented.   Last Vitals:  Vitals:   06/09/22 1005 06/09/22 1127  BP: (!) 146/66 (!) 114/57  Pulse:  73  Resp: 18 16  Temp: 36.7 C 36.7 C  SpO2: 100% 98%    Last Pain:  Vitals:   06/09/22 1127  TempSrc: Oral  PainSc: 0-No pain                 Odes Lolli C Candia Kingsbury

## 2022-06-09 NOTE — Interval H&P Note (Signed)
History and Physical Interval Note:  06/09/2022 10:58 AM  Natalie Hernandez  has presented today for surgery, with the diagnosis of screening, positive FIT test.  The various methods of treatment have been discussed with the patient and family. After consideration of risks, benefits and other options for treatment, the patient has consented to  Procedure(s) with comments: COLONOSCOPY WITH PROPOFOL (N/A) - 10:45 AM as a surgical intervention.  The patient's history has been reviewed, patient examined, no change in status, stable for surgery.  I have reviewed the patient's chart and labs.  Questions were answered to the patient's satisfaction.     Natalie Hernandez   no change.  Positive occult blood test.  Occasional small-volume hematochezia.  Diagnostic colonoscopy today per plan. The risks, benefits, limitations, alternatives and imponderables have been reviewed with the patient. Questions have been answered. All parties are agreeable.

## 2022-06-09 NOTE — Op Note (Signed)
[facility] Patient Name: Natalie Hernandez Procedure Date: 06/09/2022 10:56 AM MRN: [id number] Date of Birth: 1970-05-27 Attending MD: Norvel Richards , MD, [id number] CSN: [id number] Age: 52 Admit Type: Outpatient Procedure:                Colonoscopy Indications:              Positive fecal immunochemical test Providers:                Norvel Richards, MD, Lambert Mody,                            Raphael Gibney, Technician Referring MD:              Medicines:                Propofol per Anesthesia Complications:            No immediate complications. Estimated Blood Loss:     Estimated blood loss: none. Procedure:                Pre-Anesthesia Assessment:                           - Prior to the procedure, a History and Physical                            was performed, and patient medications and                            allergies were reviewed. The patient's tolerance of                            previous anesthesia was also reviewed. The risks                            and benefits of the procedure and the sedation                            options and risks were discussed with the patient.                            All questions were answered, and informed consent                            was obtained. Prior Anticoagulants: The patient has                            taken no anticoagulant or antiplatelet agents. ASA                            Grade Assessment: III - A patient with severe                            systemic disease. After reviewing the risks and  benefits, the patient was deemed in satisfactory                            condition to undergo the procedure.                           After obtaining informed consent, the colonoscope                            was passed under direct vision. Throughout the                            procedure, the patient's blood pressure, pulse, and                             oxygen saturations were monitored continuously. The                            [id number]) scope was introduced through                            the anus and advanced to the the cecum, identified                            by appendiceal orifice and ileocecal valve. The                            colonoscopy was performed without difficulty. The                            patient tolerated the procedure well. The quality                            of the bowel preparation was adequate. The                            ileocecal valve, appendiceal orifice, and rectum                            were photographed. The entire colon was well                            visualized. Scope In: 11:10:48 AM Scope Out: 11:22:32 AM Scope Withdrawal Time: 0 hours 9 minutes 31 seconds  Total Procedure Duration: 0 hours 11 minutes 44 seconds  Findings:      The perianal and digital rectal examinations were normal.      A 13 mm polyp was found in the proximal descending colon. The polyp was       pedunculated. The polyp was removed with a hot snare. Resection and       retrieval were complete. Estimated blood loss: none.      The exam was otherwise without abnormality on direct and retroflexion       views. Impression:               -  One 13 mm polyp in the proximal descending colon,                            removed with a hot snare. Resected and retrieved.                           - The examination was otherwise normal on direct                            and retroflexion views. Moderate Sedation:      Moderate (conscious) sedation was personally administered by an       anesthesia professional. The following parameters were monitored: oxygen       saturation, heart rate, blood pressure, respiratory rate, EKG, adequacy       of pulmonary ventilation, and response to care. Recommendation:           - Patient has a contact number available for                            emergencies. The signs  and symptoms of potential                            delayed complications were discussed with the                            patient. Return to normal activities tomorrow.                            Written discharge instructions were provided to the                            patient.                           - Resume previous diet.                           - Continue present medications.                           - Repeat colonoscopy date to be determined after                            pending pathology results are reviewed for                            surveillance.                           - Return to GI office in 1 month. Procedure Code(s):        --- Professional ---                           [phone], Colonoscopy, flexible; with removal of  tumor(s), polyp(s), or other lesion(s) by snare                            technique Diagnosis Code(s):        --- Professional ---                           D12.4, Benign neoplasm of descending colon                           R19.5, Other fecal abnormalities CPT copyright 2022 American Medical Association. All rights reserved. The codes documented in this report are preliminary and upon coder review may  be revised to meet current compliance requirements. Cristopher Estimable. Chrishauna Mee, MD Norvel Richards, MD 06/09/2022 11:29:23 AM This report has been signed electronically. Number of Addenda: 0

## 2022-06-10 ENCOUNTER — Encounter: Payer: Self-pay | Admitting: Internal Medicine

## 2022-06-10 LAB — SURGICAL PATHOLOGY

## 2022-06-16 ENCOUNTER — Telehealth: Payer: Self-pay | Admitting: Gastroenterology

## 2022-06-16 NOTE — Telephone Encounter (Signed)
Spoke to pt, informed her that a letter was sent with her results and it is in [place], but I did give her the results.

## 2022-06-16 NOTE — Telephone Encounter (Signed)
Patient wanted to find out if we had received the pathology report from her colonoscopy last week.

## 2022-06-22 ENCOUNTER — Encounter (HOSPITAL_COMMUNITY): Payer: Self-pay | Admitting: Internal Medicine

## 2022-07-05 ENCOUNTER — Ambulatory Visit: Payer: BC Managed Care – PPO | Admitting: Gastroenterology

## 2022-07-11 NOTE — Progress Notes (Signed)
GI Office Note    Referring Provider: Pablo Lawrence, NP Primary Care Physician:  Pablo Lawrence, NP Primary Gastroenterologist: Cristopher Estimable.[email or web address], MD  Date:  07/12/2022  ID:  Natalie Hernandez, DOB 1971-02-23, MRN [id number]   Chief Complaint   Chief Complaint  Patient presents with   Follow-up    Patient here today for a follow up visit. Patient denies any current gi issues.   History of Present Illness  Natalie Hernandez is a 52 y.o. female with a history of dyslipidemia, diabetes, HTN, hypothyroidism, vitamin D deficiency presenting today for follow-up post procedures.   Initial office visit 05/10/2022 due to positive fit test.  Reported history of chronic diarrhea with moderate blood in stools that are more normal.  States this has been going on since 2000.  Has solid stool about once per month.  At times stools are watery and yellow-tinged however mostly is loose/mushy.  Denies abdominal pain.  Does not take anything for her looser stools.  Reports need to have a bowel movement within 30 minutes of eating when she has orange juice or a sausage biscuit.  Denied any issues with appetite.  Recently placed on Mounjaro from [place] and has lost about 58 pounds.  On metformin twice daily.  Scheduled for colonoscopy.  If only loose stools despite being on Mounjaro then advised may consider adding colestipol.  Colonoscopy 06/09/2022: -13 mm polyp in the proximal descending colon removed with hot snare -Otherwise normal. -Pathology revealed tubulovillous adenoma -Recommend repeat in 3 years  Today: Since being on the [facility] or since the colonoscopy she has only had maybe one loose urgent stool. Now may go 2 days without a BM. Still has some mild pain after bowel movements on the left side of her rectum. Has some mild throbbing if she has to push but goes away within an hour. Is not with every bowel movement.  Denies any rectal bleeding, abdominal pain, nausea, vomiting.  Denies any  significant fatigue.  No urgency after eating.  Doing well overall without any GI complaints.   Current Outpatient Medications  Medication Sig Dispense Refill   allopurinol (ZYLOPRIM) 100 MG tablet Take 100 mg by mouth 2 (two) times daily.     ALPRAZolam (XANAX) 0.25 MG tablet TAKE 1 TABLET(0.25 MG) BY MOUTH TWICE DAILY AS NEEDED FOR ANXIETY 60 tablet 0   colchicine 0.6 MG tablet Take 0.6 mg by mouth daily as needed (gout flare).     fexofenadine (ALLEGRA) 180 MG tablet Take 180 mg by mouth at bedtime.     indomethacin (INDOCIN) 50 MG capsule Take 50 mg by mouth daily as needed (gout flare).     levothyroxine (SYNTHROID) 200 MCG tablet TAKE 1 TABLET(200 MCG) BY MOUTH DAILY BEFORE BREAKFAST 30 tablet 6   metFORMIN (GLUCOPHAGE) 1000 MG tablet TAKE 1 TABLET BY MOUTH TWICE DAILY WITH MEAL 180 tablet 3   metoprolol succinate (TOPROL-XL) 50 MG 24 hr tablet Take 50 mg by mouth in the morning and at bedtime.     norethindrone-ethinyl estradiol (VYFEMLA) 0.4-35 MG-MCG tablet TAKE 1 TABLET BY MOUTH EVERY DAY CONTINUOUSLY 112 tablet 4   simvastatin (ZOCOR) 20 MG tablet Take 1 tablet (20 mg total) by mouth daily. (Patient taking differently: Take 20 mg by mouth at bedtime.) 90 tablet 2   tirzepatide (MOUNJARO) 10 MG/0.5ML Pen Inject 10 mg into the skin once a week.     No current facility-administered medications for this visit.    Past Medical History:  Diagnosis  Date   Anxiety and depression 07/01/2015   Contraceptive management 10/25/2013   Diabetes 11/04/2013   Dyslipidemia 10/25/2013   Gout    Headache(784.0)    menstral migraines   History of kidney stones    Hyperlipidemia    Hypertension    Hypothyroidism    Obesity    Vitamin D deficiency 01/05/2016   Vitamin D deficiency disease     Past Surgical History:  Procedure Laterality Date   CESAREAN SECTION     CHOLECYSTECTOMY     COLONOSCOPY WITH PROPOFOL N/A 06/09/2022   Procedure: COLONOSCOPY WITH PROPOFOL;  Surgeon: Daneil Dolin, MD;  [facility];  Service: Endoscopy;  Laterality: N/A;  10:45 AM   POLYPECTOMY  06/09/2022   Procedure: POLYPECTOMY;  Surgeon: Daneil Dolin, MD;  Location: [facility];  Service: Endoscopy;;    Family History  Problem Relation Age of Onset   Hypertension Father    Diabetes Brother    Cancer Maternal Aunt        vaginal   Stroke Paternal Aunt    Cancer Maternal Grandmother        colon   CAD Paternal Grandfather    Stroke Paternal Grandmother    Cancer Maternal Grandfather        colon   Diabetes Mother    Other Brother        was shot    Allergies as of 07/12/2022 - Review Complete 07/12/2022  Allergen Reaction Noted   Celexa [citalopram hydrobromide] Rash 08/31/2015    Social History   Socioeconomic History   Marital status: Married    Spouse name: Not on file   Number of children: Not on file   Years of education: Not on file   Highest education level: Not on file  Occupational History   Not on file  Tobacco Use   Smoking status: Never   Smokeless tobacco: Never  Vaping Use   Vaping Use: Never used  Substance and Sexual Activity   Alcohol use: Yes    Comment: occassional   Drug use: No   Sexual activity: Yes    Birth control/protection: Pill  Other Topics Concern   Not on file  Social History Narrative   Not on file   Social Determinants of Health   Financial Resource Strain: Low Risk  (04/22/2022)   Overall Financial Resource Strain (CARDIA)    Difficulty of Paying Living Expenses: Not hard at all  Food Insecurity: No Food Insecurity (04/22/2022)   Hunger Vital Sign    Worried About Running Out of Food in the Last Year: Never true    Tate in the Last Year: Never true  Transportation Needs: No Transportation Needs (04/22/2022)   PRAPARE - Hydrologist (Medical): No    Lack of Transportation (Non-Medical): No  Physical Activity: Inactive (04/22/2022)   Exercise Vital Sign    Days of Exercise  per Week: 0 days    Minutes of Exercise per Session: 0 min  Stress: No Stress Concern Present (04/22/2022)   [place]    Feeling of Stress : Only a little  Social Connections: Moderately Isolated (04/22/2022)   Social Connection and Isolation Panel [NHANES]    Frequency of Communication with Friends and Family: Three times a week    Frequency of Social Gatherings with Friends and Family: Once a week    Attends Religious Services: Never  Active Member of Clubs or Organizations: No    Attends Archivist Meetings: Never    Marital Status: Married     Review of Systems   Gen: Denies fever, chills, anorexia. Denies fatigue, weakness, weight loss.  CV: Denies chest pain, palpitations, syncope, peripheral edema, and claudication. Resp: Denies dyspnea at rest, cough, wheezing, coughing up blood, and pleurisy. GI: See HPI Derm: Denies rash, itching, dry skin Psych: Denies depression, anxiety, memory loss, confusion. No homicidal or suicidal ideation.  Heme: Denies bruising, bleeding, and enlarged lymph nodes.   Physical Exam   BP 138/81 (BP Location: Left Arm, Patient Position: Sitting, Cuff Size: Large)   Pulse 74   Temp (!) 97.3 F (36.3 C) (Temporal)   Ht 6' (1.829 m)   Wt (!) 368 lb 6.4 oz (167.1 kg)   BMI 49.96 kg/m   General:   Alert and oriented. No distress noted. Pleasant and cooperative.  Head:  Normocephalic and atraumatic. Eyes:  Conjuctiva clear without scleral icterus. Mouth:  Oral mucosa pink and moist. Good dentition. No lesions. Lungs:  Clear to auscultation bilaterally. No wheezes, rales, or rhonchi. No distress.  Heart:  S1, S2 present without murmurs appreciated.  Abdomen:  +BS, soft, non-tender and non-distended. No rebound or guarding. No HSM or masses noted. Rectal: deferred Msk:  Symmetrical without gross deformities. Normal posture. Extremities:  Without edema. Neurologic:   Alert and  oriented x4 Psych:  Alert and cooperative. Normal mood and affect.   Assessment  LEAHANN WITHERINGTON is a 52 y.o. female with a history of dyslipidemia, diabetes, HTN, hypothyroidism, vitamin D deficiency presenting today for follow-up post procedures.    History of colon polyps: Previous positive FIT test. Colonoscopy 06/09/22 with large tubulovillous adenoma in the descending colon.  Due for repeat in 2027.  No longer having any rectal bleeding.  Chronic loose stools: No longer having chronic loose stools.  Also without any rectal bleeding. Had 1 occurrence since her colonoscopy. She is unsure if it is the [facility] or having the colon polyp removed but she has had complete resolution of symptoms.  Advised to let me know if she develops constipation as she may need Colace or MiraLAX to assist.  If she has recurrent diarrhea we will trial colestipol.  PLAN   Repeat TCS in 2027.  On recall. Monitor for constipation or recurrent diarrhea. If recurrent constipation may take Colace or MiraLAX Follow-up as needed    Venetia Night, MSN, FNP-BC, AGACNP-BC [facility]

## 2022-07-12 ENCOUNTER — Encounter: Payer: Self-pay | Admitting: Gastroenterology

## 2022-07-12 ENCOUNTER — Ambulatory Visit (INDEPENDENT_AMBULATORY_CARE_PROVIDER_SITE_OTHER): Payer: BC Managed Care – PPO | Admitting: Gastroenterology

## 2022-07-12 VITALS — BP 138/81 | HR 74 | Temp 97.3°F | Ht 72.0 in | Wt 368.4 lb

## 2022-07-12 DIAGNOSIS — Z86010 Personal history of colonic polyps: Secondary | ICD-10-CM | POA: Diagnosis not present

## 2022-07-12 DIAGNOSIS — R195 Other fecal abnormalities: Secondary | ICD-10-CM | POA: Diagnosis not present

## 2022-07-12 NOTE — Patient Instructions (Addendum)
Please let me know if you have any recurrent looser stools or if you begin to have worsening constipation.  If you do not have a bowel movement for more than 3 days at a time I would recommend that you start a stool softener or MiraLAX as needed.  If your looser stools return please contact the office and let me know and we will consider a medication to help bulk up your stools given you no longer have a gallbladder.  You are due for repeat colonoscopy in February 2027.  You will recall for this and we will send you a questionnaire when it is time.  Congratulations on your weight loss!  Please do not hesitate to reach out if you have any questions or new concerns.  It was a pleasure to see you today. I want to create trusting relationships with patients. If you receive a survey regarding your visit,  I greatly appreciate you taking time to fill this out on paper or through your MyChart. I value your feedback.  Venetia Night, MSN, FNP-BC, AGACNP-BC [facility]

## 2022-07-22 DIAGNOSIS — Z1330 Encounter for screening examination for mental health and behavioral disorders, unspecified: Secondary | ICD-10-CM | POA: Diagnosis not present

## 2022-07-22 DIAGNOSIS — M25522 Pain in left elbow: Secondary | ICD-10-CM | POA: Diagnosis not present

## 2022-07-22 DIAGNOSIS — M1A09X0 Idiopathic chronic gout, multiple sites, without tophus (tophi): Secondary | ICD-10-CM | POA: Diagnosis not present

## 2022-07-22 DIAGNOSIS — E1165 Type 2 diabetes mellitus with hyperglycemia: Secondary | ICD-10-CM | POA: Diagnosis not present

## 2022-07-22 DIAGNOSIS — I10 Essential (primary) hypertension: Secondary | ICD-10-CM | POA: Diagnosis not present

## 2022-07-22 DIAGNOSIS — R002 Palpitations: Secondary | ICD-10-CM | POA: Diagnosis not present

## 2022-07-22 DIAGNOSIS — E039 Hypothyroidism, unspecified: Secondary | ICD-10-CM | POA: Diagnosis not present

## 2022-08-09 DIAGNOSIS — J069 Acute upper respiratory infection, unspecified: Secondary | ICD-10-CM | POA: Diagnosis not present

## 2022-09-02 DIAGNOSIS — I10 Essential (primary) hypertension: Secondary | ICD-10-CM | POA: Diagnosis not present

## 2022-09-02 DIAGNOSIS — E039 Hypothyroidism, unspecified: Secondary | ICD-10-CM | POA: Diagnosis not present

## 2022-12-20 ENCOUNTER — Other Ambulatory Visit: Payer: Self-pay | Admitting: Adult Health

## 2022-12-20 MED ORDER — METRONIDAZOLE 500 MG PO TABS: 500.0000 mg | ORAL_TABLET | Freq: Two times a day (BID) | ORAL | 0 refills | Status: DC

## 2022-12-20 NOTE — Progress Notes (Signed)
Rx flagyl  

## 2023-01-27 DIAGNOSIS — J019 Acute sinusitis, unspecified: Secondary | ICD-10-CM | POA: Diagnosis not present

## 2023-01-27 DIAGNOSIS — B9689 Other specified bacterial agents as the cause of diseases classified elsewhere: Secondary | ICD-10-CM | POA: Diagnosis not present

## 2023-02-10 DIAGNOSIS — I10 Essential (primary) hypertension: Secondary | ICD-10-CM | POA: Diagnosis not present

## 2023-02-10 DIAGNOSIS — M778 Other enthesopathies, not elsewhere classified: Secondary | ICD-10-CM | POA: Diagnosis not present

## 2023-02-10 DIAGNOSIS — E1165 Type 2 diabetes mellitus with hyperglycemia: Secondary | ICD-10-CM | POA: Diagnosis not present

## 2023-02-10 DIAGNOSIS — E039 Hypothyroidism, unspecified: Secondary | ICD-10-CM | POA: Diagnosis not present

## 2023-03-27 ENCOUNTER — Other Ambulatory Visit (HOSPITAL_COMMUNITY): Payer: Self-pay | Admitting: Adult Health

## 2023-03-27 DIAGNOSIS — Z1231 Encounter for screening mammogram for malignant neoplasm of breast: Secondary | ICD-10-CM

## 2023-03-31 ENCOUNTER — Ambulatory Visit (HOSPITAL_COMMUNITY)
Admission: RE | Admit: 2023-03-31 | Discharge: 2023-03-31 | Disposition: A | Discharge status: Home / Self Care | Payer: BC Managed Care – PPO | Source: Ambulatory Visit | Attending: Adult Health | Admitting: Adult Health

## 2023-03-31 ENCOUNTER — Encounter (HOSPITAL_COMMUNITY): Payer: Self-pay

## 2023-03-31 DIAGNOSIS — Z1231 Encounter for screening mammogram for malignant neoplasm of breast: Secondary | ICD-10-CM | POA: Insufficient documentation

## 2023-04-28 ENCOUNTER — Ambulatory Visit: Payer: BC Managed Care – PPO | Admitting: Adult Health

## 2023-04-28 ENCOUNTER — Encounter: Payer: Self-pay | Admitting: Adult Health

## 2023-04-28 VITALS — BP 105/64 | HR 68 | Ht 72.0 in | Wt 332.0 lb

## 2023-04-28 DIAGNOSIS — Z1211 Encounter for screening for malignant neoplasm of colon: Secondary | ICD-10-CM

## 2023-04-28 DIAGNOSIS — Z1331 Encounter for screening for depression: Secondary | ICD-10-CM | POA: Diagnosis not present

## 2023-04-28 DIAGNOSIS — Z3041 Encounter for surveillance of contraceptive pills: Secondary | ICD-10-CM

## 2023-04-28 DIAGNOSIS — Z01419 Encounter for gynecological examination (general) (routine) without abnormal findings: Secondary | ICD-10-CM | POA: Diagnosis not present

## 2023-04-28 LAB — HEMOCCULT GUIAC POC 1CARD (OFFICE): Fecal Occult Blood, POC: NEGATIVE

## 2023-04-28 NOTE — Progress Notes (Signed)
Patient ID: Natalie Hernandez, female   DOB: [birth year], 53 y.o.   MRN: [id number] History of Present Illness: Natalie Hernandez is a 53 year old white female, married, G1P1001, in for a well woman gyn exam.     Component Value Date/Time   DIAGPAP (A) 04/22/2022 0847    - Atypical squamous cells of undetermined significance (ASC-US)   DIAGPAP  03/25/2019 1036    - Negative for intraepithelial lesion or malignancy (NILM)   HPVHIGH Negative 04/22/2022 0847   HPVHIGH Negative 03/25/2019 1036   ADEQPAP  04/22/2022 0847    Satisfactory for evaluation; transformation zone component PRESENT.   ADEQPAP  03/25/2019 1036    Satisfactory for evaluation; transformation zone component ABSENT.   PCP is Ronny Bacon NP   Current Medications, Allergies, Past Medical History, Past Surgical History, Family History and Social History were reviewed in [employer] record.     Review of Systems: Patient denies any headaches, hearing loss, fatigue, blurred vision, shortness of breath, chest pain, abdominal pain, problems with bowel movements, urination, or intercourse. No joint pain or mood swings.  No periods on COC Has lost about 90 lbs in about 3 years, 35 in last year  Physical Exam:BP 105/64 (BP Location: Left Arm, Patient Position: Sitting, Cuff Size: Normal)   Pulse 68   Ht 6' (1.829 m)   Wt (!) 332 lb (150.6 kg)   BMI 45.03 kg/m   General:  Well developed, well nourished, no acute distress Skin:  Warm and dry Neck:  Midline trachea, normal thyroid, good ROM, no lymphadenopathy Lungs; Clear to auscultation bilaterally Breast:  No dominant palpable mass, retraction, or nipple discharge Cardiovascular: Regular rate and rhythm Abdomen:  Soft, non tender, no hepatosplenomegaly Pelvic:  External genitalia is normal in appearance, no lesions.  The vagina is normal in appearance. Urethra has no lesions or masses. The cervix is smooth.  Uterus is felt to be normal size, shape, and contour.  No  adnexal masses or tenderness noted.Bladder is non tender, no masses felt. Rectal: Good sphincter tone, no polyps, or hemorrhoids felt.  Hemoccult negative. Extremities/musculoskeletal:  No swelling or varicosities noted, no clubbing or cyanosis Psych:  No mood changes, alert and cooperative,seems happy AA is 1 Fall risk is low    04/28/2023    9:38 AM 04/22/2022    8:43 AM 04/08/2021   10:24 AM  Depression screen PHQ 2/9  Decreased Interest 0 0 0  Down, Depressed, Hopeless 0 0 0  PHQ - 2 Score 0 0 0  Altered sleeping 1 0 1  Tired, decreased energy 1 1 1   Change in appetite 0 0 1  Feeling bad or failure about yourself  0 0 0  Trouble concentrating 0 0 0  Moving slowly or fidgety/restless 0 0 0  Suicidal thoughts 0 0 0  PHQ-9 Score 2 1 3        04/28/2023    9:38 AM 04/22/2022    8:43 AM 04/08/2021   10:24 AM 04/07/2020    2:58 PM  GAD 7 : Generalized Anxiety Score  Nervous, Anxious, on Edge 1 1 1 1   Control/stop worrying 0 0 0 1  Worry too much - different things 0 0 1 1  Trouble relaxing 0 0 0 1  Restless 0 0 0 0  Easily annoyed or irritable 1 0 0 0  Afraid - awful might happen 0 0 0 0  Total GAD 7 Score 2 1 2  4  Examination chaperoned by Tish RN  Impression and plan: 1. Encounter for well woman exam with routine gynecological exam (Primary) Physical in 1 year Pap in 2027 Labs with PCP Mammogram was negative 03/31/23 Had colonoscopy 06/09/22 had a polyp, repeat in 3 years she says Praised over weight loss, listens to [employer], NO BS WEIGHT LOSS Discussed trying Chub Rub where skin touches skin in thigh and groin area   2. Encounter for screening fecal occult blood testing Hemoccult was negative  - POCT occult blood stool  3. Encounter for surveillance of contraceptive pills Happy with Vyfemia,  no period, was refilled 04/23/23

## 2023-05-06 IMAGING — MG MM DIGITAL SCREENING BILAT W/ TOMO AND CAD
8 of 14 series · 8 of 40 positions shown · non-contrast
Comparison: Previous exam(s).

ACR Breast Density Category a: The breast tissue is almost entirely
fatty.

CLINICAL DATA: Screening.

EXAM:
DIGITAL SCREENING BILATERAL MAMMOGRAM WITH TOMOSYNTHESIS AND CAD
TECHNIQUE: Bilateral screening digital craniocaudal and mediolateral oblique
mammograms were obtained. Bilateral screening digital breast
tomosynthesis was performed. The images were evaluated with
computer-aided detection.

[R MLO synth-2D (1 of 2)]
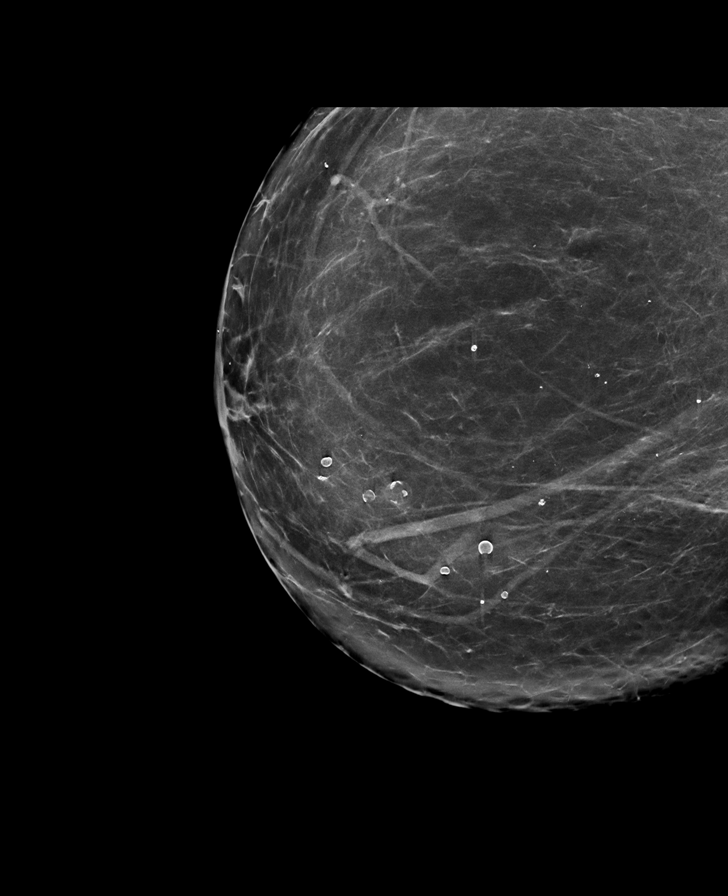

[R MLO synth-2D (2 of 2)]
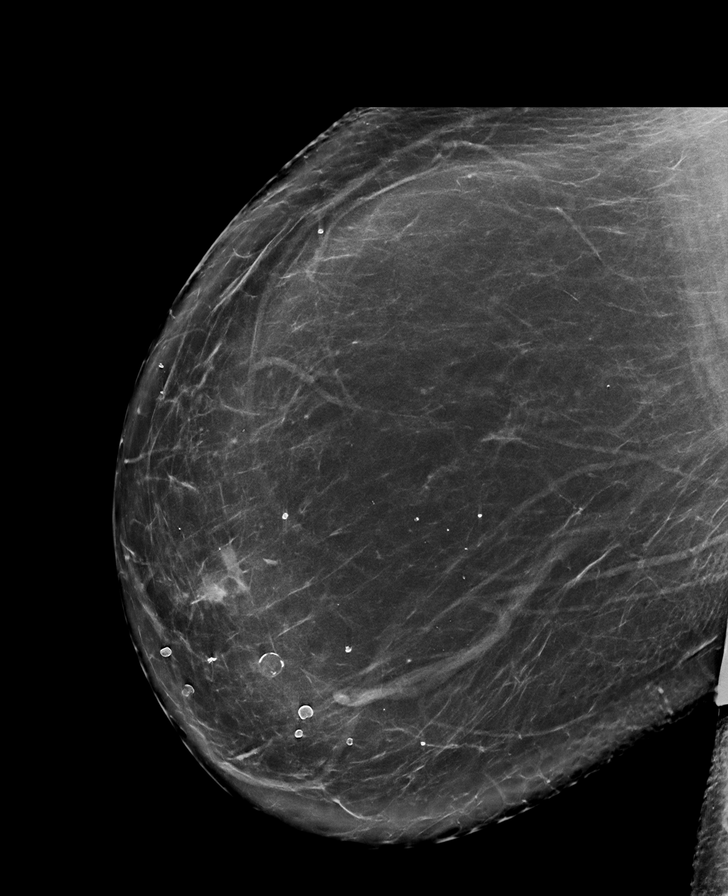

[L CC synth-2D (1 of 2)]
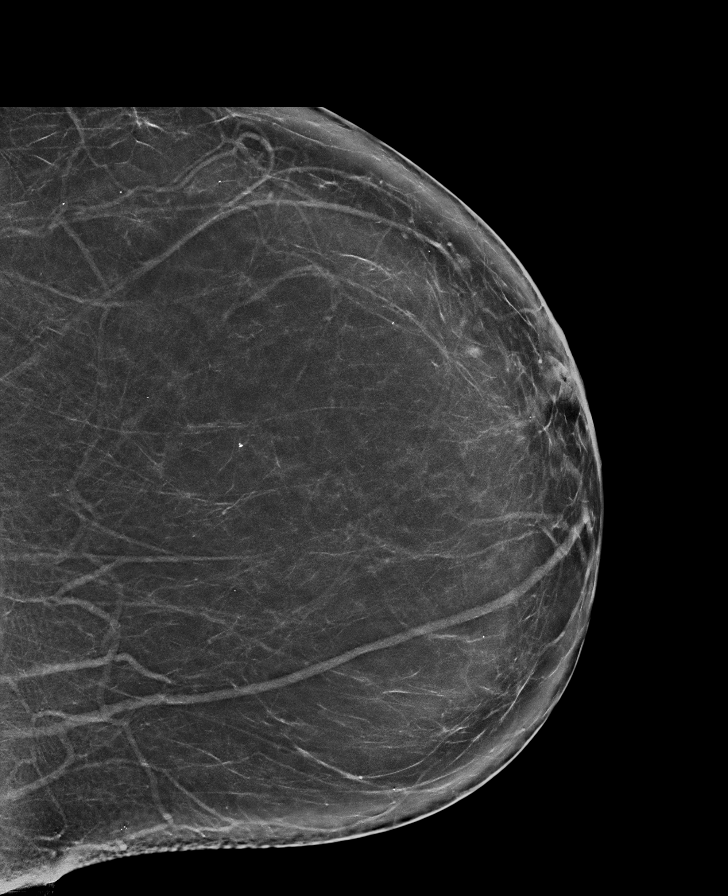

[L MLO synth-2D (1 of 2)]
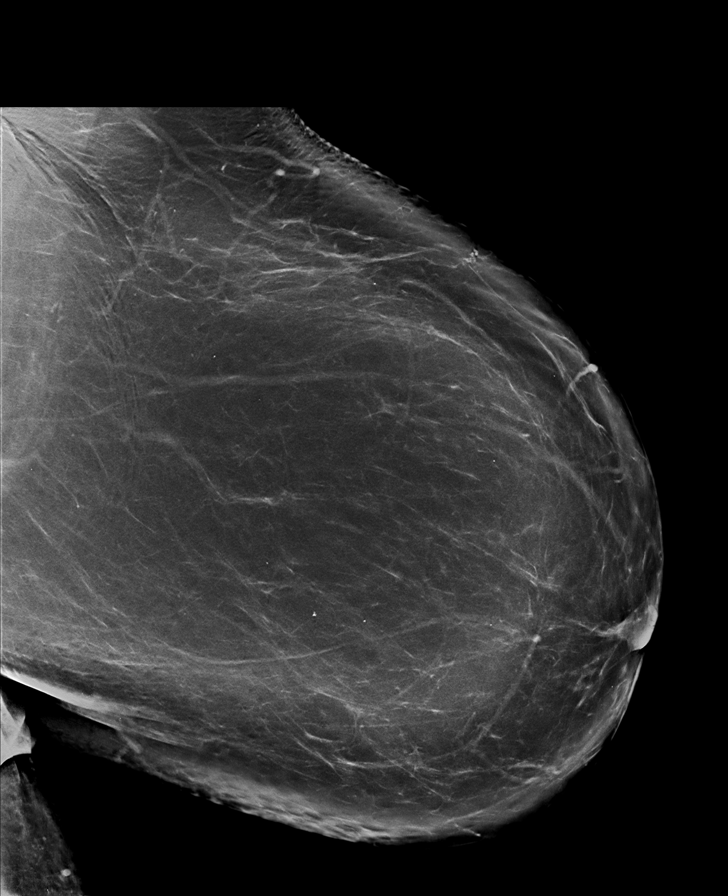

[R CC synth-2D]
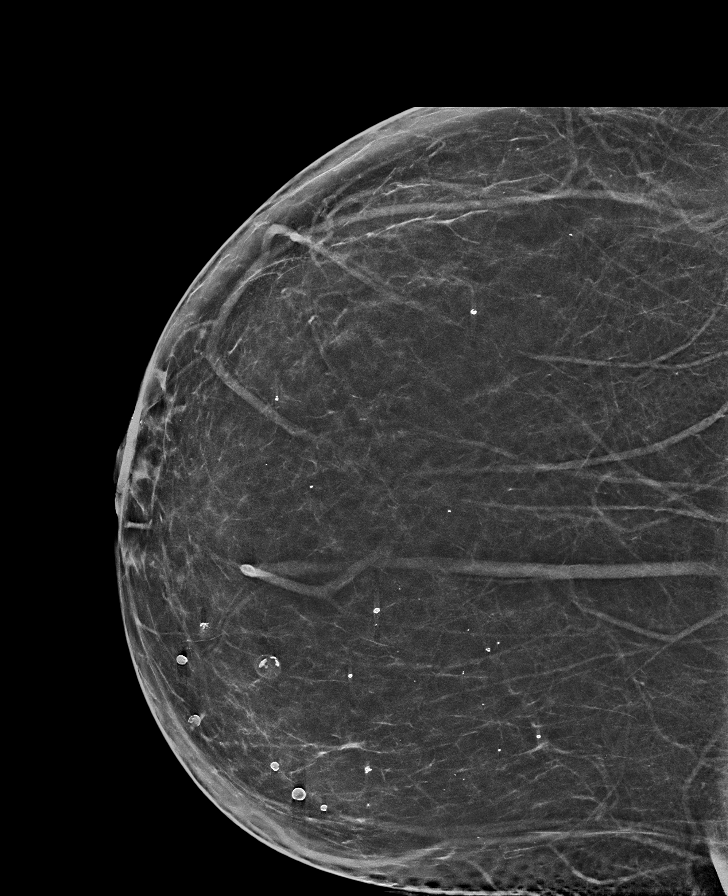

[L MLO synth-2D (2 of 2)]
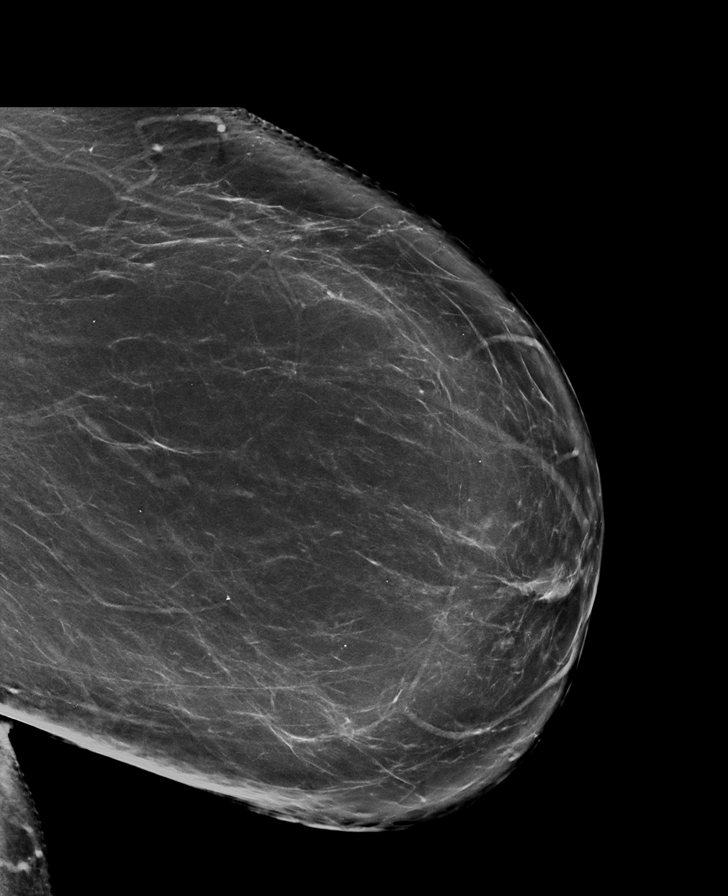

[L CC synth-2D (2 of 2)]
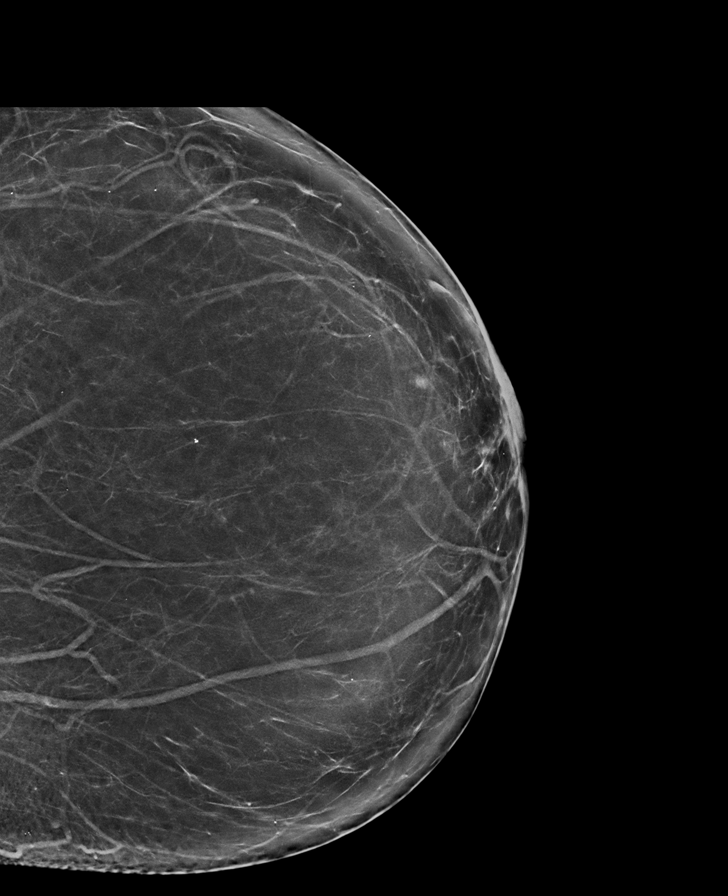

[L MLO tomo · tomo slice 55/110.0]
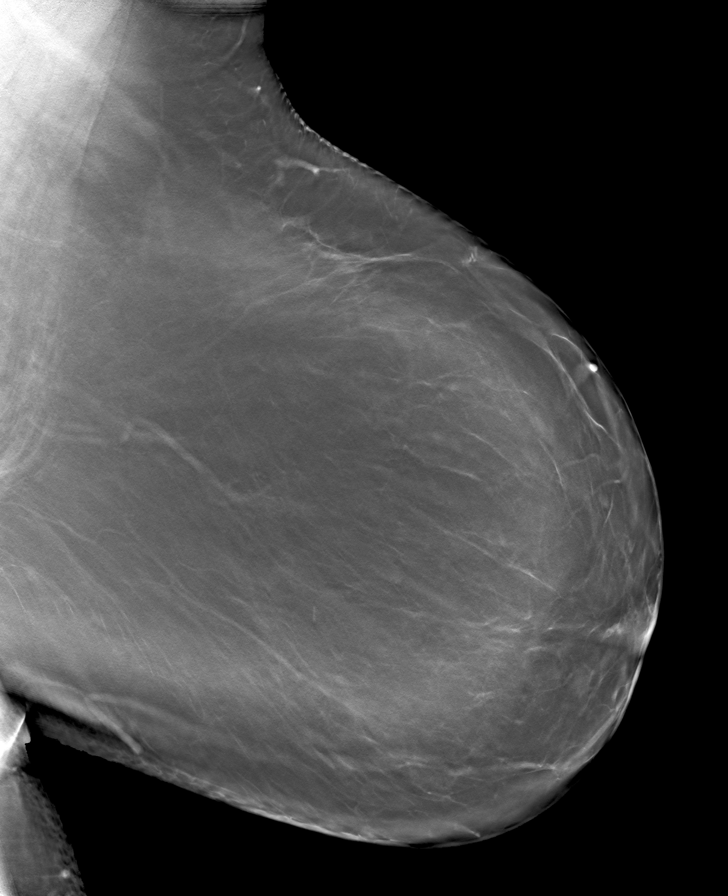

[8 of 40 positions shown; findings below may reference images not displayed]

FINDINGS: There are no findings suspicious for malignancy.
IMPRESSION: No mammographic evidence of malignancy. A result letter of this
screening mammogram will be mailed directly to the patient.

RECOMMENDATION:
Screening mammogram in one year. (Code:0E-3-N98)

BI-RADS CATEGORY  1: Negative.

## 2023-06-16 DIAGNOSIS — Z1330 Encounter for screening examination for mental health and behavioral disorders, unspecified: Secondary | ICD-10-CM | POA: Diagnosis not present

## 2023-06-16 DIAGNOSIS — E559 Vitamin D deficiency, unspecified: Secondary | ICD-10-CM | POA: Diagnosis not present

## 2023-06-16 DIAGNOSIS — E1165 Type 2 diabetes mellitus with hyperglycemia: Secondary | ICD-10-CM | POA: Diagnosis not present

## 2023-06-16 DIAGNOSIS — Z0000 Encounter for general adult medical examination without abnormal findings: Secondary | ICD-10-CM | POA: Diagnosis not present

## 2023-06-16 DIAGNOSIS — E782 Mixed hyperlipidemia: Secondary | ICD-10-CM | POA: Diagnosis not present

## 2023-06-16 DIAGNOSIS — E039 Hypothyroidism, unspecified: Secondary | ICD-10-CM | POA: Diagnosis not present

## 2023-06-16 NOTE — Progress Notes (Signed)
 Subjective   Patient ID:  Natalie Hernandez is a 53 y.o. (DOB May 21, 1970) female    Patient presents with  . Annual Exam     HPI  Natalie Hernandez is here today for her routine physical.  She overall reports doing very well.  She is taking all of her medications as prescribed.  She has had increased heart flutters which she had before when her thyroid  was hyperactive.  Will recheck that today.  Her A1c is wonderfully controlled at 5.4.  We will stop her daily metformin  at this time and monitor.  She does struggle with some constipation with her GLP-1, she is using MiraLAX which seems to help.  She is up-to-date on her well woman visit, mammogram and eye exam.   Reviewed and updated this visit by provider: None        Review of Systems  Constitutional: Negative.   HENT: Negative.    Respiratory: Negative.    Cardiovascular:  Positive for palpitations.  Musculoskeletal: Negative.   Neurological: Negative.      Objective   Vitals:   06/16/23 0958  BP: 123/69  Patient Position: Sitting  Pulse: 64  Temp: 97.6 F (36.4 C)  TempSrc: Temporal  Resp: 18  Height: 6' (1.829 m)  Weight: (!) 331 lb (150.1 kg)  SpO2: 98%  BMI (Calculated): 44.9  PainSc: 0-No pain     Physical Exam Constitutional:      Appearance: Normal appearance. She is obese.  HENT:     Head: Normocephalic.     Right Ear: Tympanic membrane, ear canal and external ear normal.     Left Ear: Tympanic membrane, ear canal and external ear normal.     Nose: Nose normal.     Mouth/Throat:     Mouth: Mucous membranes are moist.     Pharynx: Oropharynx is clear.  Eyes:     Extraocular Movements: Extraocular movements intact.     Conjunctiva/sclera: Conjunctivae normal.     Pupils: Pupils are equal, round, and reactive to light.  Cardiovascular:     Rate and Rhythm: Normal rate and regular rhythm.     Pulses: Normal pulses.     Heart sounds: No murmur heard. Musculoskeletal:        General: Normal range of motion.      Cervical back: Normal range of motion.  Pulmonary:     Effort: Pulmonary effort is normal.  Abdominal:     General: Abdomen is flat. Bowel sounds are normal.     Palpations: Abdomen is soft.  Lymphadenopathy:     Cervical: No cervical adenopathy.  Skin:    General: Skin is warm and dry.     Capillary Refill: Capillary refill takes less than 2 seconds.  Neurological:     General: No focal deficit present.     Mental Status: She is alert and oriented to person, place, and time. Mental status is at baseline.  Psychiatric:        Mood and Affect: Mood normal.        Behavior: Behavior normal.        Thought Content: Thought content normal.        Judgment: Judgment normal.        Assessment and Plan  1. Annual physical exam (Primary) -     CBC And Differential -     Comprehensive Metabolic Panel -     TSH+Free T4 2. Type 2 diabetes mellitus with hyperglycemia, without long-term current use of insulin   (*) -  Lipid Panel With LDL/HDL Ratio; Future -     POCT Hemoglobin A1C -     CBC And Differential -     Comprehensive Metabolic Panel 3. Mixed hyperlipidemia -     Lipid Panel With LDL/HDL Ratio; Future -     Comprehensive Metabolic Panel 4. Acquired hypothyroidism -     CBC And Differential -     TSH+Free T4 5. Vitamin D  deficiency -     Vitamin D  25 Hydroxy; Future 6. Class 3 severe obesity due to excess calories with serious comorbidity and body mass index (BMI) of 40.0 to 44.9 in adult (*) Assessment & Plan: Positive 55-60 lb weight loss this year.  Please monitor weight. Suggested healthier eating habits: decrease carbohydrates, sugars and starches. Increase lean meats and green vegetables. Try to exercise 20-30 minutes 4 times a week.       Labs today, follow up with results Stop metformin  today  Follow up in about 6 months (around 12/17/2023) for medication recheck, lab follow up.     - Health maintenance issues including appropriate cancer screening, healthy  diet, exercise and tobacco avoidance were discussed with the patient.  I've encouraged healthy lifestyle modifications of eating fruits/vegetables, decreased fat intake, regular daily exercise, and decrease stress.  - Risks, benefits, and alternatives of the medications and treatment plan prescribed today were discussed, and patient expressed understanding.  - Labs ordered today:  We will call pt with results. - Discussed exercising 30 minutes at least 5 days per week and eating healthy, consistent of fruits, vegetables, and lean meats. - Follow up in about 1 year for annual physical exam, Chronic medical problems. - Return to clinic to be reevaluated if symptoms worsen, persist, change, or if you have any other concerns. - I discussed this diagnosis with the patient and discussed the treatment plan with them. This treatment plan is also outlined in the Patient Instructions and a copy of this was provided to the patient.   Patient's Medications  New Prescriptions   No medications on file  Previous Medications   ALLOPURINOL  (ZYLOPRIM ) 100 MG TABLET    Take one tablet (100 mg dose) by mouth 2 (two) times daily.   ALPRAZOLAM  (XANAX ) 0.25 MG TABLET    TAKE 1 TABLET(0.25 MG) BY MOUTH DAILY AS NEEDED FOR SLEEP. MAX DAILY AMOUNT: 0.25 MG   CETIRIZINE (ZYRTEC ALLERGY) 10 MG TABLET       COLCHICINE 0.6 MG TABLET    Take one tablet (0.6 mg dose) by mouth 2 (two) times a day as needed.   INDOMETHACIN  (INDOCIN ) 50 MG CAPSULE       LEVOTHYROXINE  SODIUM (SYNTHROID ) 150 MCG TABLET    Take one tablet (150 mcg dose) by mouth daily.   LISINOPRIL  (PRINIVIL ,ZESTRIL ) 10 MG TABLET    TAKE 1 TABLET(10 MG) BY MOUTH DAILY   METOPROLOL SUCCINATE (TOPROL-XL) 50 MG 24 HR TABLET    Take one tablet (50 mg dose) by mouth at bedtime.   NORETHINDRONE -ETHINYL ESTRADIOL  (VYFEMLA ) 0.4-35 MG-MCG PER TABLET       SIMVASTATIN  (ZOCOR ) 20 MG TABLET    TAKE 1 TABLET(20 MG) BY MOUTH AT BEDTIME   TIRZEPATIDE (MOUNJARO) 15 MG/0.5 ML SOAJ  INJECTION    Inject 0.5 mLs (15 mg dose) into the skin once a week.  Modified Medications   No medications on file  Discontinued Medications   METFORMIN  ER (GLUCOPHAGE -XR) 500 MG 24 HR TABLET    Take one tablet (500 mg dose) by mouth with  breakfast.      Risks, benefits, and alternatives of the medications and treatment plan prescribed today were discussed, and patient expressed understanding. Plan follow-up as discussed or as needed if any worsening symptoms or change in condition.   A yearly preventative health exam was recommended and current age based recommendations were discussed.  I have reviewed the information contained in this note and personally verified its accuracy.  MDM billing - I personally developed the plan of care based on documented medical decision making. Charmaine Heller, NP

## 2023-07-10 ENCOUNTER — Other Ambulatory Visit: Payer: Self-pay | Admitting: Adult Health

## 2023-11-24 ENCOUNTER — Encounter (HOSPITAL_COMMUNITY): Payer: Self-pay | Admitting: Emergency Medicine

## 2023-11-24 ENCOUNTER — Emergency Department (HOSPITAL_COMMUNITY)
Admission: EM | Admit: 2023-11-24 | Discharge: 2023-11-24 | Disposition: A | Discharge status: Home / Self Care | Attending: Emergency Medicine | Admitting: Emergency Medicine

## 2023-11-24 ENCOUNTER — Other Ambulatory Visit: Payer: Self-pay

## 2023-11-24 ENCOUNTER — Emergency Department (HOSPITAL_COMMUNITY)

## 2023-11-24 DIAGNOSIS — R188 Other ascites: Secondary | ICD-10-CM | POA: Diagnosis not present

## 2023-11-24 DIAGNOSIS — K529 Noninfective gastroenteritis and colitis, unspecified: Secondary | ICD-10-CM | POA: Insufficient documentation

## 2023-11-24 DIAGNOSIS — Z794 Long term (current) use of insulin: Secondary | ICD-10-CM | POA: Insufficient documentation

## 2023-11-24 DIAGNOSIS — R109 Unspecified abdominal pain: Secondary | ICD-10-CM | POA: Diagnosis not present

## 2023-11-24 DIAGNOSIS — N200 Calculus of kidney: Secondary | ICD-10-CM | POA: Diagnosis not present

## 2023-11-24 LAB — CBC WITH DIFFERENTIAL/PLATELET
Abs Immature Granulocytes: 0.02 K/uL (ref 0.00–0.07)
Basophils Absolute: 0 K/uL (ref 0.0–0.1)
Basophils Relative: 0 %
Eosinophils Absolute: 0.1 K/uL (ref 0.0–0.5)
Eosinophils Relative: 1 %
HCT: 43 % (ref 36.0–46.0)
Hemoglobin: 14.2 g/dL (ref 12.0–15.0)
Immature Granulocytes: 0 %
Lymphocytes Relative: 20 %
Lymphs Abs: 2.1 K/uL (ref 0.7–4.0)
MCH: 29.8 pg (ref 26.0–34.0)
MCHC: 33 g/dL (ref 30.0–36.0)
MCV: 90.3 fL (ref 80.0–100.0)
Monocytes Absolute: 0.5 K/uL (ref 0.1–1.0)
Monocytes Relative: 5 %
Neutro Abs: 7.6 K/uL (ref 1.7–7.7)
Neutrophils Relative %: 74 %
Platelets: 262 K/uL (ref 150–400)
RBC: 4.76 MIL/uL (ref 3.87–5.11)
RDW: 13.2 % (ref 11.5–15.5)
WBC: 10.2 K/uL (ref 4.0–10.5)
nRBC: 0 % (ref 0.0–0.2)

## 2023-11-24 LAB — URINALYSIS, ROUTINE W REFLEX MICROSCOPIC
Glucose, UA: NEGATIVE mg/dL
Ketones, ur: NEGATIVE mg/dL
Nitrite: NEGATIVE
Protein, ur: 30 mg/dL — AB
Specific Gravity, Urine: 1.019 (ref 1.005–1.030)
pH: 8 (ref 5.0–8.0)

## 2023-11-24 LAB — COMPREHENSIVE METABOLIC PANEL WITH GFR
ALT: 9 U/L (ref 0–44)
AST: 14 U/L — ABNORMAL LOW (ref 15–41)
Albumin: 2.9 g/dL — ABNORMAL LOW (ref 3.5–5.0)
Alkaline Phosphatase: 44 U/L (ref 38–126)
Anion gap: 12 (ref 5–15)
BUN: 10 mg/dL (ref 6–20)
CO2: 24 mmol/L (ref 22–32)
Calcium: 8.5 mg/dL — ABNORMAL LOW (ref 8.9–10.3)
Chloride: 100 mmol/L (ref 98–111)
Creatinine, Ser: 0.91 mg/dL (ref 0.44–1.00)
GFR, Estimated: >60 mL/min (ref >60)
Glucose, Bld: 122 mg/dL — ABNORMAL HIGH (ref 70–99)
Potassium: 3.8 mmol/L (ref 3.5–5.1)
Sodium: 136 mmol/L (ref 135–145)
Total Bilirubin: 1 mg/dL (ref 0.0–1.2)
Total Protein: 6 g/dL — ABNORMAL LOW (ref 6.5–8.1)

## 2023-11-24 LAB — PREGNANCY, URINE: Preg Test, Ur: NEGATIVE

## 2023-11-24 LAB — LIPASE, BLOOD: Lipase: 24 U/L (ref 11–51)

## 2023-11-24 MED ORDER — FENTANYL CITRATE (PF) 100 MCG/2ML IJ SOLN
50.0000 ug | Freq: Once | INTRAMUSCULAR | Status: AC
  Administered 2023-11-24: 50 ug via INTRAVENOUS
  Filled 2023-11-24: qty 2

## 2023-11-24 MED ORDER — SODIUM CHLORIDE 0.9 % IV BOLUS
1000.0000 mL | Freq: Once | INTRAVENOUS | Status: AC
  Administered 2023-11-24: 1000 mL via INTRAVENOUS

## 2023-11-24 MED ORDER — IOHEXOL 300 MG/ML  SOLN: 100.0000 mL | Freq: Once | INTRAMUSCULAR | Status: DC | PRN

## 2023-11-24 MED ORDER — AMOXICILLIN-POT CLAVULANATE 875-125 MG PO TABS: 1.0000 | ORAL_TABLET | Freq: Two times a day (BID) | ORAL | 0 refills | Status: DC

## 2023-11-24 MED ORDER — IOHEXOL 350 MG/ML SOLN
100.0000 mL | Freq: Once | INTRAVENOUS | Status: AC | PRN
  Administered 2023-11-24: 73 mL via INTRAVENOUS

## 2023-11-24 MED ORDER — ONDANSETRON HCL 4 MG/2ML IJ SOLN
4.0000 mg | Freq: Once | INTRAMUSCULAR | Status: AC
  Administered 2023-11-24: 4 mg via INTRAVENOUS
  Filled 2023-11-24: qty 2

## 2023-11-24 NOTE — ED Triage Notes (Signed)
 Pt c/o of lower abd pain that started yesterday morning @ 0200. Pt states she now has diarrhea.

## 2023-11-24 NOTE — ED Notes (Signed)
 Patient transported to CT

## 2023-11-24 NOTE — Discharge Instructions (Addendum)
 You were seen in the emergency department for abdominal pain and diarrhea.  Your lab work was fairly unremarkable but your CAT scan showed some inflammatory changes in your small bowel called your ileum.  We are starting you on some antibiotics.  Please start with a clear liquid diet and advance as tolerated.  Follow-up with your primary care doctor and your GI team.  Return to the emergency department if any worsening or concerning symptoms.

## 2023-11-24 NOTE — ED Provider Notes (Signed)
 [facility] AT [facility] Provider Note   CSN: [id number] Arrival date & time: 11/24/23  9288     Patient presents with: Abdominal Pain   Natalie Hernandez is a 53 y.o. female.  She is here with a complaint of worsening abdominal pain that started early yesterday morning.  Initially it was low and crampy and felt like gas.  Became more generalized and had some diarrhea starting last night.  No blood in the stool.  No urinary symptoms.  Nausea no vomiting.  No fevers or chills.  Prior history of C-section and cholecystectomy.  Is on chronic birth control and does not get periods.  No vaginal bleeding or discharge.  History of a colonoscopy 2 years ago with a polyp   The history is provided by the patient.  Abdominal Pain Pain location:  Generalized Pain quality: aching   Pain severity:  Moderate Onset quality:  Gradual Duration:  2 days Timing:  Constant Progression:  Worsening Chronicity:  New Relieved by:  None tried Worsened by:  Nothing Ineffective treatments:  None tried Associated symptoms: diarrhea and nausea   Associated symptoms: no chest pain, no constipation, no cough, no dysuria, no fever, no hematemesis, no hematochezia, no vaginal bleeding, no vaginal discharge and no vomiting        Prior to Admission medications   Medication Sig Start Date End Date Taking? Authorizing Provider  allopurinol  (ZYLOPRIM ) 100 MG tablet Take 100 mg by mouth 2 (two) times daily. 12/14/21   [provider]  ALPRAZolam  (XANAX ) 0.25 MG tablet TAKE 1 TABLET(0.25 MG) BY MOUTH TWICE DAILY AS NEEDED FOR ANXIETY 07/29/19   Signa Nest A, NP  colchicine 0.6 MG tablet Take 0.6 mg by mouth daily as needed (gout flare).    [provider]  fexofenadine (ALLEGRA) 180 MG tablet Take 180 mg by mouth at bedtime.    [provider]  indomethacin  (INDOCIN ) 50 MG capsule Take 50 mg by mouth daily as needed (gout flare).    [provider]   levothyroxine  (SYNTHROID ) 150 MCG tablet Take 150 mcg by mouth daily.    [provider]  lisinopril  (ZESTRIL ) 10 MG tablet Take 10 mg by mouth daily.    [provider]  metFORMIN  (GLUCOPHAGE -XR) 500 MG 24 hr tablet Take 500 mg by mouth daily with breakfast. 02/10/23   [provider]  metoprolol succinate (TOPROL-XL) 50 MG 24 hr tablet Take 50 mg by mouth at bedtime. 02/10/23   [provider]  MOUNJARO 15 MG/0.5ML Pen Inject 15 mg into the skin once a week.    [provider]  norethindrone -ethinyl estradiol  (VYFEMLA ) 0.4-35 MG-MCG tablet TAKE 1 TABLET BY MOUTH DAILY CONTINUOUSLY 07/10/23   Signa Nest LABOR, NP  simvastatin  (ZOCOR ) 20 MG tablet Take 1 tablet (20 mg total) by mouth daily. Patient taking differently: Take 20 mg by mouth at bedtime. 02/02/21   Signa Nest LABOR, NP    Allergies: Celexa  [citalopram  hydrobromide]    Review of Systems  Constitutional:  Negative for fever.  Respiratory:  Negative for cough.   Cardiovascular:  Negative for chest pain.  Gastrointestinal:  Positive for abdominal pain, diarrhea and nausea. Negative for constipation, hematemesis, hematochezia and vomiting.  Genitourinary:  Negative for dysuria, vaginal bleeding and vaginal discharge.    Updated Vital Signs BP 130/60   Pulse 77   Temp 98.5 F (36.9 C) (Oral)   Ht 6' (1.829 m)   Wt (!) 150 kg  SpO2 96%   BMI 44.85 kg/m   Physical Exam Vitals and nursing note reviewed.  Constitutional:      General: She is not in acute distress.    Appearance: Normal appearance. She is well-developed. She is obese.  HENT:     Head: Normocephalic and atraumatic.  Eyes:     Conjunctiva/sclera: Conjunctivae normal.  Cardiovascular:     Rate and Rhythm: Normal rate and regular rhythm.     Heart sounds: No murmur heard. Pulmonary:     Effort: Pulmonary effort is normal. No respiratory distress.     Breath sounds: Normal breath sounds. No stridor. No  wheezing.  Abdominal:     Palpations: Abdomen is soft.     Tenderness: There is generalized abdominal tenderness. There is no guarding or rebound.  Musculoskeletal:        General: No tenderness or deformity. Normal range of motion.     Cervical back: Neck supple.  Skin:    General: Skin is warm and dry.  Neurological:     General: No focal deficit present.     Mental Status: She is alert.     GCS: GCS eye subscore is 4. GCS verbal subscore is 5. GCS motor subscore is 6.     (all labs ordered are listed, but only abnormal results are displayed) Labs Reviewed  COMPREHENSIVE METABOLIC PANEL WITH GFR - Abnormal; Notable for the following components:      Result Value   Glucose, Bld 122 (*)    Calcium 8.5 (*)    Total Protein 6.0 (*)    Albumin 2.9 (*)    AST 14 (*)    All other components within normal limits  URINALYSIS, ROUTINE W REFLEX MICROSCOPIC - Abnormal; Notable for the following components:   Hgb urine dipstick SMALL (*)    Bilirubin Urine SMALL (*)    Protein, ur 30 (*)    Leukocytes,Ua TRACE (*)    Bacteria, UA RARE (*)    All other components within normal limits  GASTROINTESTINAL PANEL BY PCR, STOOL (REPLACES STOOL CULTURE)  LIPASE, BLOOD  CBC WITH DIFFERENTIAL/PLATELET  PREGNANCY, URINE    EKG: None  Radiology: CT ABDOMEN PELVIS W CONTRAST Result Date: 11/24/2023 CLINICAL DATA:  Abdominal pain, acute, nonlocalized EXAM: CT ABDOMEN AND PELVIS WITH CONTRAST TECHNIQUE: Multidetector CT imaging of the abdomen and pelvis was performed using the standard protocol following bolus administration of intravenous contrast. RADIATION DOSE REDUCTION: This exam was performed according to the departmental dose-optimization program which includes automated exposure control, adjustment of the mA and/or kV according to patient size and/or use of iterative reconstruction technique. CONTRAST:  73mL OMNIPAQUE  IOHEXOL  350 MG/ML SOLN COMPARISON:  None available. FINDINGS: Lower chest:  No focal airspace consolidation or pleural effusion.Posterior bibasilar dependent atelectasis. Hepatobiliary: No mass.Cholecystectomy. No intrahepatic or extrahepatic biliary ductal dilation. The portal veins are patent. Pancreas: No mass or main ductal dilation. No peripancreatic inflammation or fluid collection. Spleen: Normal size. No mass. Adrenals/Urinary Tract: No adrenal masses. No renal mass. Punctate nonobstructive right interpolar region calculus. The urinary bladder is completely decompressed. Stomach/Bowel: The stomach is decompressed without focal abnormality. No small bowel obstruction. There is mild wall thickening throughout a few segments of small bowel in the right lower quadrant.Normal appendix, best visualized on the sagittal images. Vascular/Lymphatic: No aortic aneurysm. Scattered aortoiliac atherosclerosis. No intraabdominal or pelvic lymphadenopathy. Reproductive: The uterus and ovaries are within normal limits for patient's age.Small volume free fluid in the pelvis. Other: No pneumoperitoneum. There is  mesenteric edema in the right lower quadrant with small volume perihepatic ascites. Musculoskeletal: No acute fracture or destructive lesion. Multilevel degenerative disc disease of the spine. Thoracic DISH. IMPRESSION: 1. Findings most likely reflecting an infectious or inflammatory ileitis. Small volume ascites in the abdomen, likely reactive in nature. 2. Punctate nonobstructive right renal calculus.  No hydronephrosis. Aortic Atherosclerosis (ICD10-I70.0). Electronically Signed   By: Rogelia Myers M.D.   On: 11/24/2023 09:55     Procedures   Medications Ordered in the ED  ondansetron  (ZOFRAN ) injection 4 mg (has no administration in time range)  sodium chloride  0.9 % bolus 1,000 mL (has no administration in time range)  fentaNYL  (SUBLIMAZE ) injection 50 mcg (has no administration in time range)    Clinical Course as of 11/24/23 1700  Fri Nov 24, 2023  1057 Reviewed results  with patient.  She is unable to give a stool sample here.  Will cover with antibiotics for this ileitis. [MB]    Clinical Course User Index [MB] Towana Ozell BROCKS, MD                                 Medical Decision Making Amount and/or Complexity of Data Reviewed Labs: ordered. Radiology: ordered.  Risk Prescription drug management.   This patient complains of abdominal pain diarrhea; this involves an extensive number of treatment Options and is a complaint that carries with it a high risk of complications and morbidity. The differential includes colitis, diverticulitis, UTI, perforation, appendicitis  I ordered, reviewed and interpreted labs, which included CBC normal, chemistries with mildly elevated glucose, urinalysis without clear signs of infection, pregnancy test negative, stool studies ordered not obtained I ordered medication IV fluids pain medicine nausea medicine and reviewed PMP when indicated. I ordered imaging studies which included CT abdomen and pelvis and I independently    visualized and interpreted imaging which showed infectious versus inflammatory ileitis Additional history obtained from patient's husband Previous records obtained and reviewed in epic no recent admissions Cardiac monitoring reviewed, normal sinus rhythm Social determinants considered, no significant barriers Critical Interventions: None  After the interventions stated above, I reevaluated the patient and found patient still to have a benign exam and nontoxic-appearing Admission and further testing considered, no indications for admission.  Will cover with antibiotics and recommend close follow-up with PCP and GI.  Return instructions discussed      Final diagnoses:  Ileitis    ED Discharge Orders          Ordered    amoxicillin -clavulanate (AUGMENTIN ) 875-125 MG tablet  Every 12 hours        11/24/23 1058               Towana Ozell BROCKS, MD 11/24/23 1702

## 2023-11-29 ENCOUNTER — Encounter: Payer: Self-pay | Admitting: Gastroenterology

## 2023-12-22 DIAGNOSIS — E1165 Type 2 diabetes mellitus with hyperglycemia: Secondary | ICD-10-CM | POA: Diagnosis not present

## 2023-12-22 DIAGNOSIS — I10 Essential (primary) hypertension: Secondary | ICD-10-CM | POA: Diagnosis not present

## 2023-12-22 DIAGNOSIS — E039 Hypothyroidism, unspecified: Secondary | ICD-10-CM | POA: Diagnosis not present

## 2023-12-22 DIAGNOSIS — E782 Mixed hyperlipidemia: Secondary | ICD-10-CM | POA: Diagnosis not present

## 2023-12-22 NOTE — Progress Notes (Signed)
 Subjective   Patient ID:  Natalie Hernandez is a 53 y.o. (DOB [birth year]) female    Patient presents with  . Medication Management     HPI  Natalie Hernandez is here today for her diabetic check-in.  We did have to restart metformin  after stopping it because her blood sugar started to go up.  She had an episode of ileitis where she had to go to the emergency room, I suspect this was related to her GLP-1 therapy and we backed that down from 15-5 and she has done well on the 5 mg, we will slowly increase to the 7.5 for diabetes.\  She does need updated refills today, her blood pressure is very well-controlled with lisinopril  and metoprolol.  She denies any physical concerns today.   Reviewed and updated this visit by provider: None        Review of Systems  Constitutional: Negative.   Respiratory: Negative.    Cardiovascular: Negative.   Neurological: Negative.   Psychiatric/Behavioral: Negative.       Objective   Vitals:   12/22/23 1211  BP: 126/73  Patient Position: Sitting  Pulse: 60  Temp: 97.6 F (36.4 C)  TempSrc: Temporal  Resp: 18  Height: 6' (1.829 m)  Weight: (!) 344 lb 3.2 oz (156.1 kg)  SpO2: 100%  BMI (Calculated): 46.7  PainSc: 0-No pain     Physical Exam Constitutional:      Appearance: Normal appearance.  HENT:     Head: Normocephalic.  Cardiovascular:     Rate and Rhythm: Normal rate and regular rhythm.     Pulses: Normal pulses.     Heart sounds: Normal heart sounds.  Pulmonary:     Effort: Pulmonary effort is normal.     Breath sounds: Normal breath sounds.  Neurological:     General: No focal deficit present.     Mental Status: She is alert and oriented to person, place, and time. Mental status is at baseline.  Psychiatric:        Mood and Affect: Mood normal.        Behavior: Behavior normal.        Thought Content: Thought content normal.        Judgment: Judgment normal.       Assessment and Plan  1. Type 2 diabetes mellitus with  hyperglycemia, without long-term current use of insulin  (*) (Primary) -     POCT ACR URINE -     POCT A1C -     Comprehensive Metabolic Panel; Future -     tirzepatide (MOUNJARO) 7.5 mg/0.5 mL SOAJ injection; Inject 0.5 mLs (7.5 mg dose) into the skin once a week., Starting Fri 12/22/2023, Normal -     metFORMIN  ER (GLUCOPHAGE -XR) 500 mg 24 hr tablet; Take one tablet (500 mg dose) by mouth 1 (one) time each day with breakfast., Starting Fri 12/22/2023, Normal 2. Acquired hypothyroidism -     TSH+Free T4; Future 3. Essential hypertension -     lisinopril  (PRINIVIL ,ZESTRIL ) 10 mg tablet; Take one tablet (10 mg dose) by mouth daily., Starting Fri 12/22/2023, Normal -     metoprolol succinate (TOPROL-XL) 50 mg 24 hr tablet; Take one tablet (50 mg dose) by mouth at bedtime., Starting Fri 12/22/2023, Normal 4. Mixed hyperlipidemia -     simvastatin  (ZOCOR ) 20 mg tablet; Take one tablet (20 mg dose) by mouth at bedtime., Starting Fri 12/22/2023, Normal -     Lipid Panel With LDL/HDL Ratio; Future    Labs today,  follow up with results  Follow up in about 6 months (around 06/20/2024) for physical, lab follow up.     - Health maintenance issues including appropriate cancer screening, healthy diet, exercise and tobacco avoidance were discussed with the patient.  I've encouraged healthy lifestyle modifications of eating fruits/vegetables, decreased fat intake, regular daily exercise, and decrease stress.  - Risks, benefits, and alternatives of the medications and treatment plan prescribed today were discussed, and patient expressed understanding.  - Labs ordered today:  We will call pt with results. - Discussed exercising 30 minutes at least 5 days per week and eating healthy, consistent of fruits, vegetables, and lean meats. - Follow up in about 1 year for annual physical exam, Chronic medical problems. - Return to clinic to be reevaluated if symptoms worsen, persist, change, or if you have any other  concerns. - I discussed this diagnosis with the patient and discussed the treatment plan with them. This treatment plan is also outlined in the Patient Instructions and a copy of this was provided to the patient.   Patient's Medications  New Prescriptions   TIRZEPATIDE (MOUNJARO) 7.5 MG/0.5 ML SOAJ INJECTION    Inject 0.5 mLs (7.5 mg dose) into the skin once a week.  Previous Medications   ALLOPURINOL  (ZYLOPRIM ) 100 MG TABLET    TAKE 1 TABLET(100 MG) BY MOUTH TWICE DAILY   ALPRAZOLAM  (XANAX ) 0.25 MG TABLET    TAKE 1 TABLET(0.25 MG) BY MOUTH DAILY AS NEEDED FOR SLEEP. MAX DAILY AMOUNT: 0.25 MG   CETIRIZINE (ZYRTEC ALLERGY) 10 MG TABLET       COLCHICINE 0.6 MG TABLET    Take one tablet (0.6 mg dose) by mouth 2 (two) times a day as needed.   INDOMETHACIN  (INDOCIN ) 50 MG CAPSULE       LEVOTHYROXINE  SODIUM (SYNTHROID ,LEVOTHROID,LEVOXYL ) 150 MCG TABLET    TAKE 1 TABLET(150 MCG) BY MOUTH DAILY   NORETHINDRONE -ETHINYL ESTRADIOL  (VYFEMLA ) 0.4-35 MG-MCG PER TABLET      Modified Medications   Modified Medication Previous Medication   LISINOPRIL  (PRINIVIL ,ZESTRIL ) 10 MG TABLET lisinopril  (PRINIVIL ,ZESTRIL ) 10 mg tablet      Take one tablet (10 mg dose) by mouth daily.    TAKE 1 TABLET(10 MG) BY MOUTH DAILY   METFORMIN  ER (GLUCOPHAGE -XR) 500 MG 24 HR TABLET metFORMIN  ER (GLUCOPHAGE -XR) 500 mg 24 hr tablet      Take one tablet (500 mg dose) by mouth 1 (one) time each day with breakfast.    Take one tablet (500 mg dose) by mouth 1 (one) time each day with breakfast.   METOPROLOL SUCCINATE (TOPROL-XL) 50 MG 24 HR TABLET metoprolol succinate (TOPROL-XL) 50 mg 24 hr tablet      Take one tablet (50 mg dose) by mouth at bedtime.    TAKE 1 TABLET(50 MG) BY MOUTH AT BEDTIME   SIMVASTATIN  (ZOCOR ) 20 MG TABLET simvastatin  (ZOCOR ) 20 mg tablet      Take one tablet (20 mg dose) by mouth at bedtime.    TAKE 1 TABLET(20 MG) BY MOUTH AT BEDTIME  Discontinued Medications   PREDNISONE  (DELTASONE ) 20 MG TABLET    Take two  tablets daily for 5 days as needed for gout   TIRZEPATIDE (MOUNJARO) 5 MG/0.5 ML SOAJ PEN INJECTION    Inject 0.5 mLs (5 mg dose) into the skin once a week.      Risks, benefits, and alternatives of the medications and treatment plan prescribed today were discussed, and patient expressed understanding. Plan follow-up as discussed or as needed  if any worsening symptoms or change in condition.   A yearly preventative health exam was recommended and current age based recommendations were discussed.  I have reviewed the information contained in this note and personally verified its accuracy.  MDM billing - I personally developed the plan of care based on documented medical decision making. Charmaine Heller, NP

## 2023-12-22 NOTE — Progress Notes (Signed)
 Diabetic foot exam:  Left: Monofilament test: Sensation normal  Pulses: normal and present  Skin: Normal and no erythema, no cyanosis or pallor   Other findings: none Right: Monofilament test: Sensation normal  Pulses: normal and present  Skin: Normal and no erythema, no cyanosis or pallor   Other findings: none Exam performed with shoes and socks removed.

## 2023-12-22 NOTE — Patient Instructions (Signed)
 Patient Education   Diabetes and diet  The Basics  Written by the doctors and editors at [employer]  Why is diet important if I have diabetes? --  Diet is important because it is part of diabetes treatment. Many people need to change what they eat and how much they eat to help treat their diabetes. It is important for people to treat their diabetes so they:  Keep their blood sugar at goal  Prevent long-term problems, such as heart or kidney problems, that can happen in people with diabetes Changing your diet can also help treat obesity, high blood pressure, and high cholesterol. These conditions can affect people with diabetes and can lead to future problems, such as heart attacks or strokes. Who will help me change my diet? --  Your doctor or nurse will work with you to make a food plan to change your diet. They might also recommend that you work with a dietitian. A dietitian is an expert on food and eating. Do I need to eat at the same times every day? --  When and how often you should eat depends, in part, on the diabetes medicines you take. For example:  People who take about the same amount of insulin  at the same time each day (called a fixed regimen) should eat meals at the same times. This is also true for people who take pills that increase insulin  levels, such as sulfonylureas. Eating meals at the same time every day helps prevent low blood sugar.  People who adjust the dose and timing of their insulin  each day (called a flexible regimen) do not always have to eat meals at the same time. That's because they can time their insulin  dose for before they plan to eat, and also adjust the dose for how much they plan to eat.  People who take medicines that don't usually cause low blood sugar, such as metformin , don't have to eat meals at the same time every day. What do I need to think about when planning what to eat? --  Our bodies break down the food we eat into small pieces called  carbohydrates, proteins, and fats. When planning what to eat, people with diabetes need to think about:  Carbohydrates (or carbs) - These are sugars the body uses for energy. They can raise your blood sugar level. Your doctor, nurse, or dietitian will tell you how many carbs you should eat at each meal or snack. Foods that have carbs include:  Bread, pasta, and rice  Vegetables and fruits  Dairy foods  Foods and drinks with added sugar It is best to get your carbs from fruits, vegetables, whole grains, and low-fat milk. It is best to avoid drinks with added sugar, like soda, juices, and sports drinks.  Protein - Your doctor, nurse, or dietitian will tell you how much protein you should eat each day. It is best to eat lean meats, fish, eggs, beans, peas, soy products, nuts, and seeds. Avoid or limit processed meats like bacon, hot dogs, and sausages.  Fats - The type of fat you eat is more important than the amount of fat. Saturated and trans fats can increase your risk for heart problems, like a heart attack.  Foods that have saturated fats include meat, butter, cheese, and ice cream.  Foods that have trans fats include processed food with partially hydrogenated oils on the ingredient list. This might include fried foods, store-bought cookies, muffins, pies, and cakes. Monounsaturated and polyunsaturated fats are better for  you. Foods with these types of fat include fish, avocado, olive oil, and nuts.  Calories - You need to eat a certain amount of calories each day to keep your weight the same. If you have excess body weight and want to lose weight, you need to eat fewer calories each day.  Fiber - Eating foods with a lot of fiber can help manage your blood sugar level. Foods that have a lot of fiber include apples, green beans, peas, beans, lentils, nuts, oatmeal, and whole grains.  Salt - People who have high blood pressure should not eat foods that contain a lot of salt (also called  sodium). People with high blood pressure should also eat healthy foods, such as fruits, vegetables, and low-fat dairy foods.  Alcohol and sugary drinks - Having more than 1 alcoholic drink (for females) or 2 drinks (for males) a day can raise blood sugar levels. Also, sugary drinks like fruit juice or soda can raise blood sugar levels. How can I lose weight? --  You can:  Exercise - Try to get at least 30 minutes of physical activity a day, most days of the week. Even gentle exercise, like walking, is good for your health. Some people with diabetes need to change their medicine dose before they exercise. They might also need to check their blood sugar levels before and after exercising.  Eat fewer calories - Your doctor, nurse, or dietitian can tell you how many calories you should eat each day to lose weight. If you are worried about your weight, size, or shape, talk with your doctor, nurse, or dietitian. They can help you make changes to improve your health. Can I eat the same foods as my family? --  Yes. You do not need to eat special foods if you have diabetes. You and your family can eat the same foods. Changing your diet is mostly about eating healthy foods in healthy amounts. What are the other parts of diabetes treatment? --  Besides changing your diet, the other parts of diabetes treatment are:  Exercise  Medicines Some people with diabetes need to learn how to match their diet and exercise with their medicine dose. For example, people who use insulin  might need to choose the dose of insulin  they give themselves. To choose their dose, they need to think about:  What they plan to eat at the next meal  How much exercise they plan to do  What their blood sugar level is If the diet and exercise do not match the medicine dose, a person's blood sugar level can get too low or too high. Blood sugar levels that are too low or too high can cause problems. All topics are updated as new evidence  becomes available and our peer review process is complete. This topic retrieved from [employer] on: Feb 22, 2023. Topic 15730 Version 14.0 Release: 32.9.3 - C32.316  2024 [employer], [employer] and/or its affiliates. All rights reserved. Administrator, arts: This generalized information is a limited summary of diagnosis, treatment, and/or medication information. It is not meant to be comprehensive and should be used as a tool to help the user understand and/or assess potential diagnostic and treatment options. It does NOT include all information about conditions, treatments, medications, side effects, or risks that may apply to a specific patient. It is not intended to be medical advice or a substitute for the medical advice, diagnosis, or treatment of a health care provider based on the health care  provider's examination and assessment of a patient's specific and unique circumstances. Patients must speak with a health care provider for complete information about their health, medical questions, and treatment options, including any risks or benefits regarding use of medications. This information does not endorse any treatments or medications as safe, effective, or approved for treating a specific patient. [employer], [employer] and its affiliates disclaim any warranty or liability relating to this information or the use thereof.The use of this information is governed by the Terms of Use, available at https://www.wolterskluwer.com/en/know/clinical-effectiveness-terms. 2024 [employer], [employer] and its affiliates and/or licensors. All rights reserved. Copyright   2024 [employer], Inc. and/or its affiliates. All rights reserved.

## 2024-01-17 ENCOUNTER — Ambulatory Visit: Admitting: Gastroenterology

## 2024-01-17 VITALS — BP 106/71 | HR 69 | Temp 98.0°F | Ht 72.0 in | Wt 344.4 lb

## 2024-01-17 DIAGNOSIS — K529 Noninfective gastroenteritis and colitis, unspecified: Secondary | ICD-10-CM | POA: Insufficient documentation

## 2024-01-17 DIAGNOSIS — R110 Nausea: Secondary | ICD-10-CM | POA: Diagnosis not present

## 2024-01-17 DIAGNOSIS — Z8719 Personal history of other diseases of the digestive system: Secondary | ICD-10-CM | POA: Diagnosis not present

## 2024-01-17 DIAGNOSIS — R1084 Generalized abdominal pain: Secondary | ICD-10-CM

## 2024-01-17 DIAGNOSIS — R109 Unspecified abdominal pain: Secondary | ICD-10-CM | POA: Insufficient documentation

## 2024-01-17 MED ORDER — PANTOPRAZOLE SODIUM 40 MG PO TBEC: 40.0000 mg | DELAYED_RELEASE_TABLET | Freq: Every day | ORAL | 3 refills | Status: AC

## 2024-01-17 MED ORDER — ONDANSETRON HCL 4 MG PO TABS: 4.0000 mg | ORAL_TABLET | Freq: Three times a day (TID) | ORAL | 2 refills | Status: AC | PRN

## 2024-01-17 MED ORDER — DICYCLOMINE HCL 10 MG PO CAPS
10.0000 mg | ORAL_CAPSULE | Freq: Three times a day (TID) | ORAL | 1 refills | Status: DC
Start: 2024-01-17 — End: 2024-01-18

## 2024-01-17 NOTE — Patient Instructions (Signed)
 Please have blood work today at [facility], which is next door. They will give you the stool containers as well. Please complete the calprotectin container as soon as you can. The Cdiff and GI panel are only if you start having diarrhea.   I have sent in pantoprazole to take once each morning, 30 minutes before breakfast.  Zofran  one tablet every 8 hours as needed for nausea. Dicyclomine before eating, up to 4 times a day, for abdominal cramping and looser stool. Please take this sparingly, as it can cause constipation, dry mouth, dizziness. Only take if absolutely needed and start with one to see how you do.  Please call or message if worsening pain, and we will need to do a stat CT. We can even arrange this in [place] if we need to.  Further recommendations after labs!  We will see you in 6 weeks regardless.  It was a pleasure to see you today. I want to create trusting relationships with patients and provide genuine, compassionate, and quality care. I truly value your feedback, so please be on the lookout for a survey regarding your visit with me today. I appreciate your time in completing this!         Therisa MICAEL Stager, PhD, ANP-BC [facility]

## 2024-01-17 NOTE — Progress Notes (Unsigned)
 Gastroenterology Office Note     Primary Care Physician:  Suanne Pfeiffer, NP  Primary Gastroenterologist:   Chief Complaint   Chief Complaint  Patient presents with   Ileitis    Pt is having abdominal pain, can't get comfortable. No diarrhea. Having nausea but no vomiting.      History of Present Illness   Natalie Hernandez is a 53 y.o. female presenting today with a history of    dyslipidemia, diabetes, HTN, hypothyroidism, vitamin D  deficiency, chronic diarrhea datin back to 2000  Diarrhea stopped when stopping metformin  in November. End of May, had a 24 hour episode while taking diclofenac, symptoms including nausea, unwell feeling, gas-like pain, burning, went away. In August, had recurrent onset that progressed into severe diarrhea and abdominal pain. Improved with abx. Stopped Mounjaro for 2 weeks and restarted at 55 mg. Last Friday evening increased to 7.5. Had recurrent symptoms on Monday. Monday drank sprite for lunch, started having nausea, worsened till the evening and took some leftover amoxicillin . Last night woke up with abdominal discomfort, burning in upper abdomen, with lower abdomen heavy crampy period type feeling, everything sore and uncomfortable. No diarrhea this episode. Last BM this morning. Normal. No rectal bleeding. No Ibuprofen. No indomethacin  in 6 months. None since May.  Had been taking Miralax while off Metformin  as had more constipation. Nausea constant right now. +flatus.    No unintentional weight loss or lack of appetite.   Aug 2025: Hgb 14.2, albumin 2.9, CT abd/pelvis with contrast Nov 24, 2023 with mild wall thickening throughout a few segments of small bowel in RLQ, infectious vs inflammatory ileitis. Prescribed Augmentin .   Outside TSH 1.030.    Colonoscopy 06/09/2022: -13 mm polyp in the proximal descending colon removed with hot snare -Otherwise normal. -Pathology revealed tubulovillous adenoma -Recommend repeat in 3  years   No FH IBD No FH celiac diseas    Past Medical History:  Diagnosis Date   Anxiety and depression 07/01/2015   Contraceptive management 10/25/2013   Diabetes (HCC) 11/04/2013   Dyslipidemia 10/25/2013   Gout    Headache(784.0)    menstral migraines   History of kidney stones    Hyperlipidemia    Hypertension    Hypothyroidism    Obesity    Vitamin D  deficiency 01/05/2016   Vitamin D  deficiency disease     Past Surgical History:  Procedure Laterality Date   CESAREAN SECTION     CHOLECYSTECTOMY     COLONOSCOPY WITH PROPOFOL  N/A 06/09/2022   Procedure: COLONOSCOPY WITH PROPOFOL ;  Surgeon: Shaaron Lamar HERO, MD;  [facility];  Service: Endoscopy;  Laterality: N/A;  10:45 AM   POLYPECTOMY  06/09/2022   Procedure: POLYPECTOMY;  Surgeon: Shaaron Lamar HERO, MD;  Location: [facility];  Service: Endoscopy;;    Current Outpatient Medications  Medication Sig Dispense Refill   allopurinol  (ZYLOPRIM ) 100 MG tablet Take 100 mg by mouth 2 (two) times daily.     ALPRAZolam  (XANAX ) 0.25 MG tablet TAKE 1 TABLET(0.25 MG) BY MOUTH TWICE DAILY AS NEEDED FOR ANXIETY 60 tablet 0   colchicine 0.6 MG tablet Take 0.6 mg by mouth daily as needed (gout flare).     dicyclomine (BENTYL) 10 MG capsule Take 1 capsule (10 mg total) by mouth 4 (four) times daily -  before meals and at bedtime. As needed for abdominal cramping and frequent stool 120 capsule 1   fexofenadine (ALLEGRA) 180 MG tablet Take 180 mg by mouth  at bedtime.     indomethacin  (INDOCIN ) 50 MG capsule Take 50 mg by mouth daily as needed (gout flare).     levothyroxine  (SYNTHROID ) 150 MCG tablet Take 150 mcg by mouth daily.     lisinopril  (ZESTRIL ) 10 MG tablet Take 10 mg by mouth daily.     metFORMIN  (GLUCOPHAGE -XR) 500 MG 24 hr tablet Take 500 mg by mouth daily with breakfast.     metoprolol succinate (TOPROL-XL) 50 MG 24 hr tablet Take 50 mg by mouth at bedtime.     MOUNJARO 15 MG/0.5ML Pen Inject 15 mg into the skin  once a week.     norethindrone -ethinyl estradiol  (VYFEMLA ) 0.4-35 MG-MCG tablet TAKE 1 TABLET BY MOUTH DAILY CONTINUOUSLY 112 tablet 4   ondansetron  (ZOFRAN ) 4 MG tablet Take 1 tablet (4 mg total) by mouth every 8 (eight) hours as needed for nausea or vomiting. 60 tablet 2   pantoprazole (PROTONIX) 40 MG tablet Take 1 tablet (40 mg total) by mouth daily. 30 minutes before breakfast 30 tablet 3   simvastatin  (ZOCOR ) 20 MG tablet Take 1 tablet (20 mg total) by mouth daily. (Patient taking differently: Take 20 mg by mouth daily. At bedtime) 90 tablet 2   No current facility-administered medications for this visit.    Allergies as of 01/17/2024 - Review Complete 01/17/2024  Allergen Reaction Noted   Celexa  [citalopram  hydrobromide] Rash 08/31/2015    Family History  Problem Relation Age of Onset   Hypertension Father    Diabetes Brother    Cancer Maternal Aunt        vaginal   Stroke Paternal Aunt    Cancer Maternal Grandmother        colon   CAD Paternal Grandfather    Stroke Paternal Grandmother    Cancer Maternal Grandfather        colon   Diabetes Mother    Other Brother        was shot    Social History   Socioeconomic History   Marital status: Married    Spouse name: Not on file   Number of children: Not on file   Years of education: Not on file   Highest education level: Not on file  Occupational History   Not on file  Tobacco Use   Smoking status: Never   Smokeless tobacco: Never  Vaping Use   Vaping status: Never Used  Substance and Sexual Activity   Alcohol use: Yes    Comment: occassional   Drug use: No   Sexual activity: Yes    Birth control/protection: Pill  Other Topics Concern   Not on file  Social History Narrative   Not on file   Social Drivers of Health   Financial Resource Strain: Low Risk  (06/13/2023)   Received from [facility]   Overall Financial Resource Strain (CARDIA)    Difficulty of Paying Living Expenses: Not hard at all  Food  Insecurity: No Food Insecurity (06/13/2023)   Received from [facility]   Hunger Vital Sign    Within the past 12 months, you worried that your food would run out before you got the money to buy more.: Never true    Within the past 12 months, the food you bought just didn't last and you didn't have money to get more.: Never true  Transportation Needs: No Transportation Needs (06/13/2023)   Received from [facility] - Transportation    Lack of Transportation (Medical): No    Lack  of Transportation (Non-Medical): No  Physical Activity: Unknown (06/13/2023)   Received from [facility]   Exercise Vital Sign    On average, how many days per week do you engage in moderate to strenuous exercise (like a brisk walk)?: 0 days    Minutes of Exercise per Session: Not on file  Recent Concern: Physical Activity - Inactive (04/28/2023)   Exercise Vital Sign    Days of Exercise per Week: 0 days    Minutes of Exercise per Session: 0 min  Stress: No Stress Concern Present (06/13/2023)   Received from [facility] - Occupational Stress Questionnaire    Feeling of Stress : Only a little  Social Connections: Moderately Integrated (06/13/2023)   Received from [facility]   Social Network    How would you rate your social network (family, work, friends)?: Adequate participation with social networks  Intimate Partner Violence: Not At Risk (06/13/2023)   Received from [facility]   HITS    Over the last 12 months how often did your partner physically hurt you?: Never    Over the last 12 months how often did your partner insult you or talk down to you?: Never    Over the last 12 months how often did your partner threaten you with physical harm?: Never    Over the last 12 months how often did your partner scream or curse at you?: Never     Review of Systems   Gen: Denies any fever, chills, fatigue, weight loss, lack of appetite.  CV: Denies chest pain,  heart palpitations, peripheral edema, syncope.  Resp: Denies shortness of breath at rest or with exertion. Denies wheezing or cough.  GI: Denies dysphagia or odynophagia. Denies jaundice, hematemesis, fecal incontinence. GU : Denies urinary burning, urinary frequency, urinary hesitancy MS: Denies joint pain, muscle weakness, cramps, or limitation of movement.  Derm: Denies rash, itching, dry skin Psych: Denies depression, anxiety, memory loss, and confusion Heme: Denies bruising, bleeding, and enlarged lymph nodes.   Physical Exam   BP 106/71 (BP Location: Right Arm, Patient Position: Sitting, Cuff Size: Large)   Pulse 69   Temp 98 F (36.7 C) (Oral)   Ht 6' (1.829 m)   Wt (!) 344 lb 6.4 oz (156.2 kg)   BMI 46.71 kg/m  General:   Alert and oriented. Pleasant and cooperative. Well-nourished and well-developed.  Head:  Normocephalic and atraumatic. Eyes:  Without icterus Abdomen:  +BS, soft, non-tender and non-distended. No HSM noted. No guarding or rebound. No masses appreciated.  Rectal:  Deferred  Msk:  Symmetrical without gross deformities. Normal posture. Extremities:  Without edema. Neurologic:  Alert and  oriented x4;  grossly normal neurologically. Skin:  Intact without significant lesions or rashes. Psych:  Alert and cooperative. Normal mood and affect.   Assessment   Natalie Hernandez is a 53 y.o. female presenting today with a history of    PLAN   *****    Therisa MICAEL Stager, PhD, ANP-BC [facility]   e

## 2024-01-18 LAB — COMPREHENSIVE METABOLIC PANEL WITH GFR
AG Ratio: 1.6 (calc) (ref 1.0–2.5)
ALT: 8 U/L (ref 6–29)
AST: 10 U/L (ref 10–35)
Albumin: 3.9 g/dL (ref 3.6–5.1)
Alkaline phosphatase (APISO): 44 U/L (ref 37–153)
BUN: 11 mg/dL (ref 7–25)
CO2: 28 mmol/L (ref 20–32)
Calcium: 9.2 mg/dL (ref 8.6–10.4)
Chloride: 102 mmol/L (ref 98–110)
Creat: 0.97 mg/dL (ref 0.50–1.03)
Globulin: 2.5 g/dL (ref 1.9–3.7)
Glucose, Bld: 97 mg/dL (ref 65–99)
Potassium: 4.8 mmol/L (ref 3.5–5.3)
Sodium: 138 mmol/L (ref 135–146)
Total Bilirubin: 0.5 mg/dL (ref 0.2–1.2)
Total Protein: 6.4 g/dL (ref 6.1–8.1)
eGFR: 70 mL/min/1.73m2 (ref >=60)

## 2024-01-18 LAB — CBC WITH DIFFERENTIAL/PLATELET
Absolute Lymphocytes: 2862 {cells}/uL (ref 850–3900)
Absolute Monocytes: 530 {cells}/uL (ref 200–950)
Basophils Absolute: 64 {cells}/uL (ref 0–200)
Basophils Relative: 0.6 %
Eosinophils Absolute: 85 {cells}/uL (ref 15–500)
Eosinophils Relative: 0.8 %
HCT: 45.4 % — ABNORMAL HIGH (ref 35.0–45.0)
Hemoglobin: 14.4 g/dL (ref 11.7–15.5)
MCH: 29.4 pg (ref 27.0–33.0)
MCHC: 31.7 g/dL — ABNORMAL LOW (ref 32.0–36.0)
MCV: 92.8 fL (ref 80.0–100.0)
MPV: 11 fL (ref 7.5–12.5)
Monocytes Relative: 5 %
Neutro Abs: 7060 {cells}/uL (ref 1500–7800)
Neutrophils Relative %: 66.6 %
Platelets: 264 Thousand/uL (ref 140–400)
RBC: 4.89 Million/uL (ref 3.80–5.10)
RDW: 12.7 % (ref 11.0–15.0)
Total Lymphocyte: 27 %
WBC: 10.6 Thousand/uL (ref 3.8–10.8)

## 2024-01-18 LAB — CELIAC DISEASE PANEL
(tTG) Ab, IgA: <1 U/mL
(tTG) Ab, IgG: <1 U/mL
Gliadin IgA: <1 U/mL
Gliadin IgG: <1 U/mL
Immunoglobulin A: 180 mg/dL (ref 47–310)

## 2024-01-18 LAB — LIPASE: Lipase: 12 U/L (ref 7–60)

## 2024-01-18 LAB — C-REACTIVE PROTEIN: CRP: 22.1 mg/L — ABNORMAL HIGH (ref <8.0)

## 2024-01-18 LAB — SEDIMENTATION RATE: Sed Rate: 6 mm/h (ref 0–30)

## 2024-01-18 MED ORDER — DICYCLOMINE HCL 10 MG PO CAPS: 10.0000 mg | ORAL_CAPSULE | Freq: Three times a day (TID) | ORAL | 1 refills | Status: AC

## 2024-01-19 ENCOUNTER — Ambulatory Visit
Admission: RE | Admit: 2024-01-19 | Discharge: 2024-01-19 | Disposition: A | Discharge status: Home / Self Care | Source: Ambulatory Visit | Attending: Gastroenterology | Admitting: Gastroenterology

## 2024-01-19 ENCOUNTER — Ambulatory Visit: Payer: Self-pay | Admitting: Gastroenterology

## 2024-01-19 ENCOUNTER — Other Ambulatory Visit: Payer: Self-pay | Admitting: *Deleted

## 2024-01-19 DIAGNOSIS — R1084 Generalized abdominal pain: Secondary | ICD-10-CM

## 2024-01-19 DIAGNOSIS — R109 Unspecified abdominal pain: Secondary | ICD-10-CM | POA: Diagnosis not present

## 2024-01-19 MED ORDER — IOPAMIDOL (ISOVUE-300) INJECTION 61%
100.0000 mL | Freq: Once | INTRAVENOUS | Status: AC | PRN
Start: 2024-01-19 — End: 2024-01-19
  Administered 2024-01-19: 100 mL via INTRAVENOUS

## 2024-01-19 NOTE — Addendum Note (Signed)
 Addended by: GAYLENE MILLING L on: 01/19/2024 10:40 AM   Modules accepted: Orders

## 2024-01-19 NOTE — Telephone Encounter (Signed)
 Pt informed that CT is scheduled for today at [facility], needs to go pick up contrast to start drinking, arrive at 12:40 pm to register. She was informed to hold Metformin  for 72 hours after scan.

## 2024-01-19 NOTE — Telephone Encounter (Signed)
 Mindy/Tammy:  Can we go ahead and order a stat CT abd/pelvis for patient due to abdominal pain and hx of ileitis? May need to be in [place] as she works there. Patient is very responsive on MyChart. Will need to stop metformin  day of procedure and for 72 hours after.

## 2024-01-20 ENCOUNTER — Ambulatory Visit: Payer: Self-pay | Admitting: Gastroenterology

## 2024-01-22 ENCOUNTER — Ambulatory Visit: Admitting: Gastroenterology

## 2024-01-22 DIAGNOSIS — K529 Noninfective gastroenteritis and colitis, unspecified: Secondary | ICD-10-CM | POA: Diagnosis not present

## 2024-01-29 LAB — GASTROINTESTINAL PATHOGEN PNL
CampyloBacter Group: NOT DETECTED
Norovirus GI/GII: NOT DETECTED
Rotavirus A: NOT DETECTED
Salmonella species: NOT DETECTED
Shiga Toxin 1: NOT DETECTED
Shiga Toxin 2: NOT DETECTED
Shigella Species: NOT DETECTED
Vibrio Group: NOT DETECTED
Yersinia enterocolitica: NOT DETECTED

## 2024-01-29 LAB — C. DIFFICILE GDH AND TOXIN A/B
GDH ANTIGEN: DETECTED
MICRO NUMBER:: 17096170
SPECIMEN QUALITY:: ADEQUATE
TOXIN A AND B: NOT DETECTED

## 2024-01-29 LAB — CALPROTECTIN: Calprotectin: 61 ug/g

## 2024-01-29 LAB — CLOSTRIDIUM DIFFICILE TOXIN B, QUALITATIVE, REAL-TIME PCR: Toxigenic C. Difficile by PCR: NOT DETECTED

## 2024-02-06 DIAGNOSIS — K529 Noninfective gastroenteritis and colitis, unspecified: Secondary | ICD-10-CM

## 2024-02-06 DIAGNOSIS — R109 Unspecified abdominal pain: Secondary | ICD-10-CM

## 2024-02-06 DIAGNOSIS — R110 Nausea: Secondary | ICD-10-CM

## 2024-02-09 NOTE — Telephone Encounter (Signed)
 Recommend arranging a colonoscopy with biopsies of TI due to CT with ileitis and EGD in near future with Dr. Shaaron, ASA 3.   If on Mounjaro, will need to hold X 1 week Hold metformin  day of procedure Pregnancy screen prior.   Please let me know when scheduled. Thanks!  Ladonna: also needs follow-up with me first available, make sure it is after her procedures.

## 2024-02-12 NOTE — Telephone Encounter (Signed)
 LMOVM to return call.

## 2024-02-13 ENCOUNTER — Ambulatory Visit (HOSPITAL_COMMUNITY)
Admission: RE | Admit: 2024-02-13 | Discharge: 2024-02-13 | Disposition: A | Discharge status: Home / Self Care | Source: Ambulatory Visit | Attending: Gastroenterology | Admitting: Gastroenterology

## 2024-02-13 DIAGNOSIS — K529 Noninfective gastroenteritis and colitis, unspecified: Secondary | ICD-10-CM | POA: Diagnosis not present

## 2024-02-13 DIAGNOSIS — R109 Unspecified abdominal pain: Secondary | ICD-10-CM | POA: Diagnosis not present

## 2024-02-13 NOTE — Addendum Note (Signed)
 Addended by: SHIRLEAN THERISA ORN on: 02/13/2024 03:12 PM   Modules accepted: Orders

## 2024-02-14 NOTE — Telephone Encounter (Signed)
 After xray went home and took Zofran  and dicyclomine as was having looser stool. Woke up at 1230 with extreme nausea, sweating, abdominal cramps with diarrhea. Took dicyclomine and zofran  and was able to rest remainder of night. Abdominal cramps have decreased but abdomen is tender. Denies distension. Feels like the worst has passed.   Not eating beef since age 53. Had pork BBQ Sunday for dinner, Monday had coffee with homemade bread and lunch chicken and chicken and dumpling Monday night. 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   Will check alpha gal panel now. If unrevealing, recommend ileocolonoscopy with EGD.

## 2024-02-14 NOTE — Addendum Note (Signed)
 Addended by: SHIRLEAN THERISA ORN on: 02/14/2024 01:15 PM   Modules accepted: Orders

## 2024-02-15 ENCOUNTER — Ambulatory Visit: Admitting: Adult Health

## 2024-02-15 ENCOUNTER — Encounter: Payer: Self-pay | Admitting: Adult Health

## 2024-02-15 VITALS — BP 132/81 | HR 72 | Ht 72.0 in | Wt 343.2 lb

## 2024-02-15 DIAGNOSIS — R109 Unspecified abdominal pain: Secondary | ICD-10-CM | POA: Diagnosis not present

## 2024-02-15 DIAGNOSIS — R110 Nausea: Secondary | ICD-10-CM | POA: Diagnosis not present

## 2024-02-15 DIAGNOSIS — N939 Abnormal uterine and vaginal bleeding, unspecified: Secondary | ICD-10-CM | POA: Diagnosis not present

## 2024-02-15 DIAGNOSIS — R1013 Epigastric pain: Secondary | ICD-10-CM | POA: Diagnosis not present

## 2024-02-15 DIAGNOSIS — R102 Pelvic and perineal pain unspecified side: Secondary | ICD-10-CM

## 2024-02-15 NOTE — Progress Notes (Signed)
  Subjective:     Patient ID: Natalie Hernandez, female   DOB: 01-Feb-1971, 53 y.o.   MRN: [id number]  HPI Natalie Hernandez is a 53 year old white female, married, G1P1001, in complaining of abdominal/pelvic pain on and off since June. She first had nausea and low abdominal pain lasted about 24 hours. Then had again in August, wakes up with nausea and pain, had pressure in rectum and vagina, had diarrhea and cramps, went to ER was dehydrated and had CT showing ileitis, antibiotics helped. Then saw GI in October, had another CT was normal, except small kidney stone. Has has elevated CRP but normal ESR and mildly elevated calprotectin level. She is carrier for  C diff.  Had labs today for [employer] panel. Antibiotics have helped and bentyl helps.     Component Value Date/Time   DIAGPAP (A) 04/22/2022 0847    - Atypical squamous cells of undetermined significance (ASC-US )   DIAGPAP  03/25/2019 1036    - Negative for intraepithelial lesion or malignancy (NILM)   HPVHIGH Negative 04/22/2022 0847   HPVHIGH Negative 03/25/2019 1036   ADEQPAP  04/22/2022 0847    Satisfactory for evaluation; transformation zone component PRESENT.   ADEQPAP  03/25/2019 1036    Satisfactory for evaluation; transformation zone component ABSENT.     PCP is JAYSON Heller NP  Review of Systems Pain started Tuesday of this week again, like 2 weeks from last episode she days  See HPI for positives   Reviewed past medical,surgical, social and family history. Reviewed medications and allergies.  Objective:   Physical Exam BP 132/81 (BP Location: Right Arm, Patient Position: Sitting, Cuff Size: Large)   Pulse 72   Ht 6' (1.829 m)   Wt (!) 343 lb 3.2 oz (155.7 kg)   BMI 46.55 kg/m     Skin warm and dry.Pelvic: external genitalia is normal in appearance no lesions, vagina: scant spotting,urethra has no lesions or masses noted, cervix:smooth and bulbous, uterus: normal size, shape and contour, mildly tender, no masses felt, adnexa: no  masses or tenderness noted. Bladder is non tender and no masses felt.  Fall risk is low  [facility] - 02/15/24 1621       Pregnancy Intention Screening   Does the patient want to become pregnant in the next year? No    Does the patient's partner want to become pregnant in the next year? No    Would the patient like to discuss contraceptive options today? No      Contraception Wrap Up   Current Method Oral Contraceptive    End Method Oral Contraceptive    Contraception Counseling Provided Yes         Examination chaperoned by Clarita Salt LPN  Assessment:     1. Pelvic pain (Primary) Has low pelvic pain/cramping Will get pelvic US  in office to assess uterus and ovaries  - US  PELVIC COMPLETE WITH TRANSVAGINAL; Future  2. Epigastric pain Has pain in epigastric area at times   3. Vaginal spotting Spots when has pain, but no other times Will get pelvic US  to assess uterus  - US  PELVIC COMPLETE WITH TRANSVAGINAL; Future     Plan:     Return in 12 days for pelvic US  in office and see me after

## 2024-02-19 LAB — ALPHA-GAL PANEL
Allergen Lamb IgE: <0.1 kU/L
Beef IgE: <0.1 kU/L
IgE (Immunoglobulin E), Serum: 38 [IU]/mL (ref 6–495)
O215-IgE Alpha-Gal: <0.1 kU/L
Pork IgE: <0.1 kU/L

## 2024-02-20 ENCOUNTER — Ambulatory Visit: Payer: Self-pay | Admitting: Gastroenterology

## 2024-02-21 ENCOUNTER — Telehealth: Payer: Self-pay | Admitting: *Deleted

## 2024-02-21 MED ORDER — PEG 3350-KCL-NA BICARB-NACL 420 G PO SOLR: 4000.0000 mL | Freq: Once | ORAL | 0 refills | Status: AC

## 2024-02-21 NOTE — Addendum Note (Signed)
 Addended by: JEANELL GRAEME RAMAN on: 02/21/2024 03:59 PM   Modules accepted: Orders

## 2024-02-21 NOTE — Telephone Encounter (Signed)
 PA approved for both procedures via [facility] Order ID: [id number]       Authorized Approval Valid Through: 02/21/2024 - 05/20/2024

## 2024-02-21 NOTE — Telephone Encounter (Addendum)
 Patient called in and wanted to schedule procedures. Scheduled for 12/3. Aware will send instructions to her mychart, rx for prep to her pharmacy and will order her urine preg test. She is also aware she will get a pre-op phone call with her arrival time.

## 2024-02-27 ENCOUNTER — Ambulatory Visit (INDEPENDENT_AMBULATORY_CARE_PROVIDER_SITE_OTHER)

## 2024-02-27 DIAGNOSIS — R102 Pelvic and perineal pain unspecified side: Secondary | ICD-10-CM | POA: Diagnosis not present

## 2024-02-27 DIAGNOSIS — N939 Abnormal uterine and vaginal bleeding, unspecified: Secondary | ICD-10-CM

## 2024-02-27 NOTE — Progress Notes (Signed)
 PELVIC US  TA/TV: homogeneous anteverted uterus with multiple fibroids,(#1) anterior fundal intramural fibroid 2 x 1.8 x 2.6 cm,(#2) posterior intramural fibroid 1.7 x 1.8 x 1.6 cm,normal ovaries (limited view),EEC 6.1 mm,no free fluid,no pain during ultrasound

## 2024-02-28 ENCOUNTER — Ambulatory Visit: Payer: Self-pay | Admitting: Adult Health

## 2024-03-11 ENCOUNTER — Other Ambulatory Visit (HOSPITAL_COMMUNITY)
Admission: RE | Admit: 2024-03-11 | Discharge: 2024-03-11 | Disposition: A | Source: Ambulatory Visit | Attending: Internal Medicine | Admitting: Internal Medicine

## 2024-03-11 DIAGNOSIS — K766 Portal hypertension: Secondary | ICD-10-CM | POA: Diagnosis not present

## 2024-03-11 DIAGNOSIS — E119 Type 2 diabetes mellitus without complications: Secondary | ICD-10-CM | POA: Diagnosis not present

## 2024-03-11 DIAGNOSIS — K3189 Other diseases of stomach and duodenum: Secondary | ICD-10-CM | POA: Diagnosis not present

## 2024-03-11 DIAGNOSIS — R110 Nausea: Secondary | ICD-10-CM | POA: Insufficient documentation

## 2024-03-11 DIAGNOSIS — E66813 Obesity, class 3: Secondary | ICD-10-CM | POA: Diagnosis not present

## 2024-03-11 DIAGNOSIS — E039 Hypothyroidism, unspecified: Secondary | ICD-10-CM | POA: Diagnosis not present

## 2024-03-11 DIAGNOSIS — K529 Noninfective gastroenteritis and colitis, unspecified: Secondary | ICD-10-CM | POA: Diagnosis not present

## 2024-03-11 DIAGNOSIS — K219 Gastro-esophageal reflux disease without esophagitis: Secondary | ICD-10-CM | POA: Diagnosis not present

## 2024-03-11 DIAGNOSIS — Z6841 Body Mass Index (BMI) 40.0 and over, adult: Secondary | ICD-10-CM | POA: Diagnosis not present

## 2024-03-11 DIAGNOSIS — I10 Essential (primary) hypertension: Secondary | ICD-10-CM | POA: Diagnosis not present

## 2024-03-11 DIAGNOSIS — R109 Unspecified abdominal pain: Secondary | ICD-10-CM | POA: Insufficient documentation

## 2024-03-11 LAB — PREGNANCY, URINE: Preg Test, Ur: NEGATIVE

## 2024-03-12 ENCOUNTER — Encounter (HOSPITAL_COMMUNITY)
Admission: RE | Admit: 2024-03-12 | Discharge: 2024-03-12 | Disposition: A | Source: Ambulatory Visit | Attending: Internal Medicine

## 2024-03-12 ENCOUNTER — Other Ambulatory Visit: Payer: Self-pay

## 2024-03-12 ENCOUNTER — Encounter (HOSPITAL_COMMUNITY): Payer: Self-pay

## 2024-03-12 ENCOUNTER — Ambulatory Visit: Admitting: Gastroenterology

## 2024-03-12 HISTORY — DX: Gastro-esophageal reflux disease without esophagitis: K21.9

## 2024-03-12 NOTE — Pre-Procedure Instructions (Signed)
 Attempted pre-op phonecall. Left VM for her to call us  back.

## 2024-03-13 ENCOUNTER — Encounter (HOSPITAL_COMMUNITY): Payer: Self-pay | Admitting: Internal Medicine

## 2024-03-13 ENCOUNTER — Ambulatory Visit (HOSPITAL_COMMUNITY)

## 2024-03-13 ENCOUNTER — Ambulatory Visit (HOSPITAL_COMMUNITY)
Admission: RE | Admit: 2024-03-13 | Discharge: 2024-03-13 | Disposition: A | Discharge status: Home / Self Care | Attending: Internal Medicine | Admitting: Internal Medicine

## 2024-03-13 ENCOUNTER — Encounter (HOSPITAL_COMMUNITY)
Admission: RE | Disposition: A | Discharge status: Home / Self Care | Payer: Self-pay | Source: Home / Self Care | Attending: Internal Medicine

## 2024-03-13 DIAGNOSIS — E66813 Obesity, class 3: Secondary | ICD-10-CM | POA: Diagnosis not present

## 2024-03-13 DIAGNOSIS — K621 Rectal polyp: Secondary | ICD-10-CM

## 2024-03-13 DIAGNOSIS — I10 Essential (primary) hypertension: Secondary | ICD-10-CM | POA: Diagnosis not present

## 2024-03-13 DIAGNOSIS — K3189 Other diseases of stomach and duodenum: Secondary | ICD-10-CM

## 2024-03-13 DIAGNOSIS — Z6841 Body Mass Index (BMI) 40.0 and over, adult: Secondary | ICD-10-CM | POA: Diagnosis not present

## 2024-03-13 DIAGNOSIS — E039 Hypothyroidism, unspecified: Secondary | ICD-10-CM | POA: Diagnosis not present

## 2024-03-13 DIAGNOSIS — K219 Gastro-esophageal reflux disease without esophagitis: Secondary | ICD-10-CM | POA: Diagnosis not present

## 2024-03-13 DIAGNOSIS — K766 Portal hypertension: Secondary | ICD-10-CM | POA: Diagnosis not present

## 2024-03-13 DIAGNOSIS — E119 Type 2 diabetes mellitus without complications: Secondary | ICD-10-CM | POA: Diagnosis not present

## 2024-03-13 DIAGNOSIS — K529 Noninfective gastroenteritis and colitis, unspecified: Secondary | ICD-10-CM

## 2024-03-13 HISTORY — PX: POLYPECTOMY: SHX149

## 2024-03-13 HISTORY — PX: COLONOSCOPY: SHX5424

## 2024-03-13 HISTORY — PX: ESOPHAGOGASTRODUODENOSCOPY: SHX5428

## 2024-03-13 LAB — GLUCOSE, CAPILLARY: Glucose-Capillary: 107 mg/dL — ABNORMAL HIGH (ref 70–99)

## 2024-03-13 SURGERY — COLONOSCOPY
Anesthesia: General

## 2024-03-13 MED ORDER — PROPOFOL 10 MG/ML IV BOLUS
INTRAVENOUS | Status: DC | PRN
  Administered 2024-03-13: 175 ug/kg/min via INTRAVENOUS
  Administered 2024-03-13 (×2): 100 mg via INTRAVENOUS

## 2024-03-13 MED ORDER — LIDOCAINE HCL (CARDIAC) PF 100 MG/5ML IV SOSY
PREFILLED_SYRINGE | INTRAVENOUS | Status: DC | PRN
  Administered 2024-03-13: 100 mg via INTRAVENOUS

## 2024-03-13 MED ORDER — LACTATED RINGERS IV SOLN: INTRAVENOUS | Status: DC

## 2024-03-13 NOTE — Transfer of Care (Signed)
 Immediate Anesthesia Transfer of Care Note  Patient: Natalie Hernandez  Procedure(s) Performed: COLONOSCOPY EGD (ESOPHAGOGASTRODUODENOSCOPY) POLYPECTOMY, INTESTINE  Patient Location: Short Stay  Anesthesia Type:General  Level of Consciousness: awake, alert , oriented, and patient cooperative  Airway & Oxygen Therapy: Patient Spontanous Breathing  Post-op Assessment: Report given to RN and Post -op Vital signs reviewed and stable  Post vital signs: Reviewed and stable  Last Vitals:  Vitals Value Taken Time  BP    Temp 36.8 C 03/13/24 13:39  Pulse 72 03/13/24 13:39  Resp 23 03/13/24 13:39  SpO2 99 % 03/13/24 13:39    Last Pain:  Vitals:   03/13/24 1339  TempSrc: Oral  PainSc: Asleep         Complications: No notable events documented.

## 2024-03-13 NOTE — H&P (Signed)
 @LOGO @   Gastroenterology Progress Note    Primary Care Physician:  Suanne Pfeiffer, NP Primary Gastroenterologist:  Dr. Shaaron  Pre-Procedure History & Physical: HPI:  Natalie Hernandez is a 53 y.o. female here for further evaluation abdominal pain and diarrhea.  Ileitis suggested on CT.  Past Medical History:  Diagnosis Date   Anxiety and depression 07/01/2015   Contraceptive management 10/25/2013   Diabetes (HCC) 11/04/2013   Dyslipidemia 10/25/2013   GERD (gastroesophageal reflux disease)    Gout    Headache(784.0)    menstral migraines   History of kidney stones    Hyperlipidemia    Hypertension    Hypothyroidism    Obesity    Vitamin D  deficiency 01/05/2016   Vitamin D  deficiency disease     Past Surgical History:  Procedure Laterality Date   CESAREAN SECTION     CHOLECYSTECTOMY     COLONOSCOPY WITH PROPOFOL  N/A 06/09/2022   Procedure: COLONOSCOPY WITH PROPOFOL ;  Surgeon: Shaaron Lamar HERO, MD;  [facility]: [facility];  Service: Endoscopy;  Laterality: N/A;  10:45 AM   POLYPECTOMY  06/09/2022   Procedure: POLYPECTOMY;  Surgeon: Shaaron Lamar HERO, MD;  Location: [facility];  Service: Endoscopy;;    Prior to Admission medications   Medication Sig Start Date End Date Taking? Authorizing Provider  allopurinol  (ZYLOPRIM ) 100 MG tablet Take 100 mg by mouth 2 (two) times daily. 12/14/21  Yes [provider]  ALPRAZolam  (XANAX ) 0.25 MG tablet TAKE 1 TABLET(0.25 MG) BY MOUTH TWICE DAILY AS NEEDED FOR ANXIETY 07/29/19  Yes Signa Nest A, NP  dicyclomine  (BENTYL ) 10 MG capsule Take 1 capsule (10 mg total) by mouth 4 (four) times daily -  before meals and at bedtime. As needed for abdominal cramping and frequent stool 01/18/24  Yes Shirlean Therisa ORN, NP  fexofenadine (ALLEGRA) 180 MG tablet Take 180 mg by mouth at bedtime.   Yes [provider]  levothyroxine  (SYNTHROID ) 150 MCG tablet Take 150 mcg by mouth daily.   Yes [provider]  lisinopril   (ZESTRIL ) 10 MG tablet Take 10 mg by mouth daily.   Yes [provider]  metFORMIN  (GLUCOPHAGE -XR) 500 MG 24 hr tablet Take 500 mg by mouth daily with breakfast. 02/10/23  Yes [provider]  metoprolol succinate (TOPROL-XL) 50 MG 24 hr tablet Take 50 mg by mouth at bedtime. 02/10/23  Yes [provider]  norethindrone -ethinyl estradiol  (VYFEMLA ) 0.4-35 MG-MCG tablet TAKE 1 TABLET BY MOUTH DAILY CONTINUOUSLY 07/10/23  Yes Signa Nest A, NP  ondansetron  (ZOFRAN ) 4 MG tablet Take 1 tablet (4 mg total) by mouth every 8 (eight) hours as needed for nausea or vomiting. 01/17/24  Yes Shirlean Therisa ORN, NP  pantoprazole  (PROTONIX ) 40 MG tablet Take 1 tablet (40 mg total) by mouth daily. 30 minutes before breakfast 01/17/24  Yes Shirlean Therisa ORN, NP  simvastatin  (ZOCOR ) 20 MG tablet Take 1 tablet (20 mg total) by mouth daily. 02/02/21  Yes Signa Nest A, NP  colchicine 0.6 MG tablet Take 0.6 mg by mouth daily as needed (gout flare).    [provider]  indomethacin  (INDOCIN ) 50 MG capsule Take 50 mg by mouth daily as needed (gout flare).    [provider]    Allergies as of 02/21/2024 - Review Complete 02/15/2024  Allergen Reaction Noted   Celexa  [citalopram  hydrobromide] Rash 08/31/2015    Family History  Problem Relation Age of Onset   Hypertension Father    Diabetes Brother  Cancer Maternal Aunt        vaginal   Stroke Paternal Aunt    Cancer Maternal Grandmother        colon   CAD Paternal Grandfather    Stroke Paternal Grandmother    Cancer Maternal Grandfather        colon   Diabetes Mother    Other Brother        was shot    Social History   Socioeconomic History   Marital status: Married    Spouse name: Not on file   Number of children: Not on file   Years of education: Not on file   Highest education level: Not on file  Occupational History   Not on file  Tobacco Use   Smoking status: Never   Smokeless tobacco: Never   Vaping Use   Vaping status: Never Used  Substance and Sexual Activity   Alcohol use: Yes    Comment: occassional   Drug use: No   Sexual activity: Yes    Birth control/protection: Pill  Other Topics Concern   Not on file  Social History Narrative   Not on file   Social Drivers of Health   Financial Resource Strain: Low Risk  (06/13/2023)   Received from [facility]   Overall Financial Resource Strain (CARDIA)    Difficulty of Paying Living Expenses: Not hard at all  Food Insecurity: No Food Insecurity (06/13/2023)   Received from [facility]   Hunger Vital Sign    Within the past 12 months, you worried that your food would run out before you got the money to buy more.: Never true    Within the past 12 months, the food you bought just didn't last and you didn't have money to get more.: Never true  Transportation Needs: No Transportation Needs (06/13/2023)   Received from [facility] - Transportation    Lack of Transportation (Medical): No    Lack of Transportation (Non-Medical): No  Physical Activity: Unknown (06/13/2023)   Received from [facility]   Exercise Vital Sign    On average, how many days per week do you engage in moderate to strenuous exercise (like a brisk walk)?: 0 days    Minutes of Exercise per Session: Not on file  Recent Concern: Physical Activity - Inactive (04/28/2023)   Exercise Vital Sign    Days of Exercise per Week: 0 days    Minutes of Exercise per Session: 0 min  Stress: No Stress Concern Present (06/13/2023)   Received from [facility] - Occupational Stress Questionnaire    Feeling of Stress : Only a little  Social Connections: Moderately Integrated (06/13/2023)   Received from [facility]   Social Network    How would you rate your social network (family, work, friends)?: Adequate participation with social networks  Intimate Partner Violence: Not At Risk (06/13/2023)   Received from [facility]   HITS    Over the last 12 months how often did your partner physically hurt you?: Never    Over the last 12 months how often did your partner insult you or talk down to you?: Never    Over the last 12 months how often did your partner threaten you with physical harm?: Never    Over the last 12 months how often did your partner scream or curse at you?: Never    Review of Systems   See HPI, otherwise negative  ROS  Physical Exam: BP 130/63   Pulse 66   Temp 97.7 F (36.5 C) (Oral)   Resp (!) 22   Ht 6' (1.829 m)   Wt (!) 154.2 kg   SpO2 100%   BMI 46.11 kg/m  General:   Alert,  Well-developed, well-nourished, pleasant and cooperative in NAD Skin:  Intact without significant lesions or rashes. Eyes:  Sclera clear, no icterus.   Conjunctiva pink. Ears:  Normal auditory acuity. Nose:  No deformity, discharge,  or lesions. Mouth:  No deformity or lesions. Neck:  Supple; no masses or thyromegaly. No significant cervical adenopathy. Lungs:  Clear throughout to auscultation.   No wheezes, crackles, or rhonchi. No acute distress. Heart:  Regular rate and rhythm; no murmurs, clicks, rubs,  or gallops. Abdomen: Non-distended, normal bowel sounds.  Soft and nontender without appreciable mass or hepatosplenomegaly.  Pulses:  Normal pulses noted. Extremities:  Without clubbing or edema.   Impression/Plan:    Abdominal pain intermittent chronic diarrhea ileitis suggested on CT denies dysphagia  Plan for EGD and colonoscopy today per plan.  The risks, benefits, limitations, imponderables and alternatives regarding both EGD and colonoscopy have been reviewed with the patient. Questions have been answered. All parties agreeable.     Notice: This dictation was prepared with Dragon dictation along with smaller phrase technology. Any transcriptional errors that result from this process are unintentional and may not be corrected upon review.

## 2024-03-13 NOTE — Discharge Instructions (Addendum)
 EGD Discharge instructions Please read the instructions outlined below and refer to this sheet in the next few weeks. These discharge instructions provide you with general information on caring for yourself after you leave the hospital. Your doctor may also give you specific instructions. While your treatment has been planned according to the most current medical practices available, unavoidable complications occasionally occur. If you have any problems or questions after discharge, please call your doctor. ACTIVITY You may resume your regular activity but move at a slower pace for the next 24 hours.  Take frequent rest periods for the next 24 hours.  Walking will help expel (get rid of) the air and reduce the bloated feeling in your abdomen.  No driving for 24 hours (because of the anesthesia (medicine) used during the test).  You may shower.  Do not sign any important legal documents or operate any machinery for 24 hours (because of the anesthesia used during the test).  NUTRITION Drink plenty of fluids.  You may resume your normal diet.  Begin with a light meal and progress to your normal diet.  Avoid alcoholic beverages for 24 hours or as instructed by your caregiver.  MEDICATIONS You may resume your normal medications unless your caregiver tells you otherwise.  WHAT YOU CAN EXPECT TODAY You may experience abdominal discomfort such as a feeling of fullness or "gas" pains.  FOLLOW-UP Your doctor will discuss the results of your test with you.  SEEK IMMEDIATE MEDICAL ATTENTION IF ANY OF THE FOLLOWING OCCUR: Excessive nausea (feeling sick to your stomach) and/or vomiting.  Severe abdominal pain and distention (swelling).  Trouble swallowing.  Temperature over 101 F (37.8 C).  Rectal bleeding or vomiting of blood.     Colonoscopy Discharge Instructions  Read the instructions outlined below and refer to this sheet in the next few weeks. These discharge instructions provide you with  general information on caring for yourself after you leave the hospital. Your doctor may also give you specific instructions. While your treatment has been planned according to the most current medical practices available, unavoidable complications occasionally occur. If you have any problems or questions after discharge, call Dr. Shaaron at [phone]. ACTIVITY You may resume your regular activity, but move at a slower pace for the next 24 hours.  Take frequent rest periods for the next 24 hours.  Walking will help get rid of the air and reduce the bloated feeling in your belly (abdomen).  No driving for 24 hours (because of the medicine (anesthesia) used during the test).   Do not sign any important legal documents or operate any machinery for 24 hours (because of the anesthesia used during the test).  NUTRITION Drink plenty of fluids.  You may resume your normal diet as instructed by your doctor.  Begin with a light meal and progress to your normal diet. Heavy or fried foods are harder to digest and may make you feel sick to your stomach (nauseated).  Avoid alcoholic beverages for 24 hours or as instructed.  MEDICATIONS You may resume your normal medications unless your doctor tells you otherwise.  WHAT YOU CAN EXPECT TODAY Some feelings of bloating in the abdomen.  Passage of more gas than usual.  Spotting of blood in your stool or on the toilet paper.  IF YOU HAD POLYPS REMOVED DURING THE COLONOSCOPY: No aspirin products for 7 days or as instructed.  No alcohol for 7 days or as instructed.  Eat a soft diet for the next 24 hours.  FINDING OUT THE RESULTS OF YOUR TEST Not all test results are available during your visit. If your test results are not back during the visit, make an appointment with your caregiver to find out the results. Do not assume everything is normal if you have not heard from your caregiver or the medical facility. It is important for you to follow up on all of your test  results.  SEEK IMMEDIATE MEDICAL ATTENTION IF: You have more than a spotting of blood in your stool.  Your belly is swollen (abdominal distention).     Stomach appeared abnormal.  Biopsies taken.  Colon and small intestine appeared normal.  Biopsies taken.  It is recommended you return for repeat colonoscopy in 3 years  Office visit with Therisa Stager in 4 to 6 weeks   further recommendations to follow pending review of pathology report   at patient request, I called Savilla Turbyfill at [phone] findings and recommendations You are nauseated or vomiting.  You have a temperature over 101.  You have abdominal pain or discomfort that is severe or gets worse throughout the day.

## 2024-03-13 NOTE — Anesthesia Preprocedure Evaluation (Signed)
 Anesthesia Evaluation  Patient identified by MRN, date of birth, ID band Patient awake    Reviewed: Allergy & Precautions, H&P , NPO status , Patient's Chart, lab work & pertinent test results  Airway Mallampati: II  TM Distance: >3 FB Neck ROM: Full    Dental no notable dental hx.    Pulmonary neg pulmonary ROS   Pulmonary exam normal breath sounds clear to auscultation       Cardiovascular hypertension, Normal cardiovascular exam Rhythm:Regular Rate:Normal     Neuro/Psych  Headaches PSYCHIATRIC DISORDERS Anxiety Depression       GI/Hepatic Neg liver ROS,GERD  ,,  Endo/Other  diabetesHypothyroidism  Class 3 obesity  Renal/GU negative Renal ROS  negative genitourinary   Musculoskeletal negative musculoskeletal ROS (+)    Abdominal  (+) + obese  Peds negative pediatric ROS (+)  Hematology negative hematology ROS (+)   Anesthesia Other Findings   Reproductive/Obstetrics negative OB ROS                              Anesthesia Physical Anesthesia Plan  ASA: 3  Anesthesia Plan: General   Post-op Pain Management:    Induction: Intravenous  PONV Risk Score and Plan:   Airway Management Planned: Nasal Cannula  Additional Equipment:   Intra-op Plan:   Post-operative Plan:   Informed Consent: I have reviewed the patients History and Physical, chart, labs and discussed the procedure including the risks, benefits and alternatives for the proposed anesthesia with the patient or authorized representative who has indicated his/her understanding and acceptance.     Dental advisory given  Plan Discussed with: CRNA  Anesthesia Plan Comments:         Anesthesia Quick Evaluation

## 2024-03-13 NOTE — Op Note (Signed)
 [facility] Patient Name: Natalie Hernandez Procedure Date: 03/13/2024 12:02 PM MRN: [id number] Date of Birth: 01-23-1971 Attending MD: Lamar Ozell Hollingshead , MD, [id number] CSN: [id number] Age: 53 Admit Type: Outpatient Procedure:                Upper GI endoscopy Indications:              Generalized abdominal pain Providers:                Lamar Ozell Hollingshead, MD, Madelin Hunter, RN, Bascom Blush Referring MD:             Lamar Ozell Hollingshead, MD Medicines:                Propofol  per Anesthesia Complications:            No immediate complications. Estimated Blood Loss:     Estimated blood loss was minimal. Procedure:                Pre-Anesthesia Assessment:                           - Prior to the procedure, a History and Physical                            was performed, and patient medications and                            allergies were reviewed. The patient's tolerance of                            previous anesthesia was also reviewed. The risks                            and benefits of the procedure and the sedation                            options and risks were discussed with the patient.                            All questions were answered, and informed consent                            was obtained. Prior Anticoagulants: The patient has                            taken no anticoagulant or antiplatelet agents. ASA                            Grade Assessment: III - A patient with severe                            systemic disease. After reviewing the risks and  benefits, the patient was deemed in satisfactory                            condition to undergo the procedure.                           After obtaining informed consent, the endoscope was                            passed under direct vision. Throughout the                            procedure, the patient's blood pressure, pulse, and                             oxygen saturations were monitored continuously. The                            [id number] ([id number]) Upper was introduced through                            the mouth, and advanced to the second part of                            duodenum. Scope In: 1:08:40 PM Scope Out: 1:14:35 PM Total Procedure Duration: 0 hours 5 minutes 55 seconds  Findings:      The examined esophagus was normal.      The duodenal bulb and second portion of the duodenum were normal.      Moderate portal hypertensive gastropathy was found in the entire       examined stomach. No ulcer or infiltrating process.      Biopsies of the abnormal appearing gastric mucosa taken for histologic       study Impression:               - Normal esophagus.                           - Normal duodenal bulb and second portion of the                            duodenum.                           - Portal hypertensive gastropathy.                           - Status post gastric biopsy Moderate Sedation:      Moderate (conscious) sedation was personally administered by an       anesthesia professional. The following parameters were monitored: oxygen       saturation, heart rate, blood pressure, respiratory rate, EKG, adequacy       of pulmonary ventilation, and response to care. Recommendation:           - Patient has a contact number available for  emergencies. The signs and symptoms of potential                            delayed complications were discussed with the                            patient. Return to normal activities tomorrow.                            Written discharge instructions were provided to the                            patient.                           - Advance diet as tolerated. Follow-up on                            pathology. See colonoscopy report. Procedure Code(s):        --- Professional ---                           [phone], Esophagogastroduodenoscopy, flexible,                             transoral; diagnostic, including collection of                            specimen(s) by brushing or washing, when performed                            (separate procedure) Diagnosis Code(s):        --- Professional ---                           K76.6, Portal hypertension                           K31.89, Other diseases of stomach and duodenum                           R10.84, Generalized abdominal pain CPT copyright 2022 American Medical Association. All rights reserved. The codes documented in this report are preliminary and upon coder review may  be revised to meet current compliance requirements. Lamar HERO. Jobin Montelongo, MD Lamar Ozell Hollingshead, MD 03/13/2024 1:39:22 PM This report has been signed electronically. Number of Addenda: 0

## 2024-03-13 NOTE — Anesthesia Postprocedure Evaluation (Signed)
 Anesthesia Post Note  Patient: Natalie Hernandez  Procedure(s) Performed: COLONOSCOPY EGD (ESOPHAGOGASTRODUODENOSCOPY) POLYPECTOMY, INTESTINE  Patient location during evaluation: PACU Anesthesia Type: General Level of consciousness: awake and alert Pain management: pain level controlled Vital Signs Assessment: post-procedure vital signs reviewed and stable Respiratory status: spontaneous breathing, nonlabored ventilation, respiratory function stable and patient connected to nasal cannula oxygen Cardiovascular status: blood pressure returned to baseline and stable Postop Assessment: no apparent nausea or vomiting Anesthetic complications: no   No notable events documented.   Last Vitals:  Vitals:   03/13/24 1132 03/13/24 1339  BP: 130/63   Pulse: 66 72  Resp: (!) 22 (!) 23  Temp: 36.5 C 36.8 C  SpO2: 100% 99%    Last Pain:  Vitals:   03/13/24 1339  TempSrc: Oral  PainSc: Asleep                 Andrea Limes

## 2024-03-13 NOTE — Op Note (Signed)
 [facility] Patient Name: Natalie Hernandez Procedure Date: 03/13/2024 11:59 AM MRN: [id number] Date of Birth: January 28, 1971 Attending MD: Lamar Ozell Hollingshead , MD, [id number] CSN: [id number] Age: 53 Admit Type: Outpatient Procedure:                Colonoscopy Indications:              Chronic diarrhea Providers:                Lamar Ozell Hollingshead, MD, Madelin Hunter, RN, Bascom Blush Referring MD:             Lamar Ozell Hollingshead, MD Medicines:                Propofol  per Anesthesia Complications:            No immediate complications. Estimated Blood Loss:     Estimated blood loss was minimal. Procedure:                After obtaining informed consent, the colonoscope                            was passed under direct vision. Throughout the                            procedure, the patient's blood pressure, pulse, and                            oxygen saturations were monitored continuously. The                            [id number] ([id number]) Colon was introduced through                            the anus and advanced to the 15 cm into the ileum.                            The colonoscopy was performed without difficulty.                            The patient tolerated the procedure well. The                            quality of the bowel preparation was adequate. The                            terminal ileum, ileocecal valve, appendiceal                            orifice, and rectum were photographed. Scope In: 1:21:13 PM Scope Out: 1:34:26 PM Scope Withdrawal Time: 0 hours 10 minutes 42 seconds  Total Procedure Duration: 0 hours 13 minutes 13 seconds  Findings:      The perianal and digital rectal examinations were normal.      The colon (entire examined portion) appeared normal. Distal 15 cm of  TI       appeared normal. Segmental biopsies of right and left colon were taken       for histologic study      The retroflexed view of the distal rectum  and anal verge was normal and       showed no anal or rectal abnormalities. Estimated blood loss: none. Impression:               - The entire examined colon is normal. Status post                            segmental biopsy. Normal-appearing terminal ileum.                           - The distal rectum and anal verge are normal on                            retroflexion view.                           - Moderate Sedation:      Moderate (conscious) sedation was personally administered by an       anesthesia professional. The following parameters were monitored: oxygen       saturation, heart rate, blood pressure, respiratory rate, EKG, adequacy       of pulmonary ventilation, and response to care. Recommendation:           - Patient has a contact number available for                            emergencies. The signs and symptoms of potential                            delayed complications were discussed with the                            patient. Return to normal activities tomorrow.                            Written discharge instructions were provided to the                            patient.                           - Advance diet as tolerated. Follow-up on                            pathology. Office visit with us  in 4 to 6 weeks see                            colonoscopy report. EGD report. Procedure Code(s):        --- Professional ---                           [phone], Colonoscopy, flexible; diagnostic, including  collection of specimen(s) by brushing or washing,                            when performed (separate procedure) Diagnosis Code(s):        --- Professional ---                           K52.9, Noninfective gastroenteritis and colitis,                            unspecified CPT copyright 2022 American Medical Association. All rights reserved. The codes documented in this report are preliminary and upon coder review may  be revised to meet  current compliance requirements. Lamar HERO. Alvin Diffee, MD Lamar Ozell Hollingshead, MD 03/13/2024 1:44:02 PM This report has been signed electronically. Number of Addenda: 0

## 2024-03-15 ENCOUNTER — Encounter (HOSPITAL_COMMUNITY): Payer: Self-pay | Admitting: Internal Medicine

## 2024-03-15 LAB — SURGICAL PATHOLOGY

## 2024-03-17 ENCOUNTER — Ambulatory Visit: Payer: Self-pay | Admitting: Internal Medicine

## 2024-03-28 NOTE — Telephone Encounter (Signed)
 Natalie Hernandez:  Is it possible to put patient in for a virtual visit on Dec 30th? I know I have a full schedule, but we could do a double book on that day.   I am not sure if her insurance covers or not. If they don't, I just need that slot held at least for a phone visit/.

## 2024-03-29 NOTE — Telephone Encounter (Signed)
 Let's put her in for a virtual on the 30th in my double book place.   Just let me know if that works for her. I will then reach out to her and get the visit done that day at some point but we can just use it as a placeholder. I will work out the time with her, just need to put her on the 30th double book, virtual, NOT in person. Thanks!

## 2024-04-02 ENCOUNTER — Other Ambulatory Visit (HOSPITAL_COMMUNITY): Payer: Self-pay | Admitting: Adult Health

## 2024-04-02 DIAGNOSIS — Z1231 Encounter for screening mammogram for malignant neoplasm of breast: Secondary | ICD-10-CM

## 2024-04-08 ENCOUNTER — Ambulatory Visit (HOSPITAL_COMMUNITY)
Admission: RE | Admit: 2024-04-08 | Discharge: 2024-04-08 | Disposition: A | Discharge status: Home / Self Care | Source: Ambulatory Visit

## 2024-04-08 DIAGNOSIS — Z1231 Encounter for screening mammogram for malignant neoplasm of breast: Secondary | ICD-10-CM | POA: Insufficient documentation

## 2024-04-09 ENCOUNTER — Telehealth: Payer: Self-pay

## 2024-04-09 ENCOUNTER — Encounter: Payer: Self-pay | Admitting: *Deleted

## 2024-04-09 ENCOUNTER — Telehealth: Admitting: Gastroenterology

## 2024-04-09 VITALS — Ht 72.0 in | Wt 350.0 lb

## 2024-04-09 DIAGNOSIS — K219 Gastro-esophageal reflux disease without esophagitis: Secondary | ICD-10-CM

## 2024-04-09 DIAGNOSIS — R109 Unspecified abdominal pain: Secondary | ICD-10-CM | POA: Diagnosis not present

## 2024-04-09 DIAGNOSIS — K766 Portal hypertension: Secondary | ICD-10-CM

## 2024-04-09 DIAGNOSIS — K3189 Other diseases of stomach and duodenum: Secondary | ICD-10-CM | POA: Insufficient documentation

## 2024-04-09 NOTE — Telephone Encounter (Signed)
 ERROR

## 2024-04-09 NOTE — Progress Notes (Unsigned)
" ° °  BM every other day, no straining. No rectal bleeding. No abdominal pain since October. No N/V. No GERD exacerbations.   Dicyclomine  only once during last episode in October.   Had started pain and cramping and took dicyclomine  with improvement in 12 hours. Never went into diarrhea situation.   Pantoprazole  once daily. Zofran  just prn for nausea and hasn't needed since last episode.   Last time took Mounjaro was October 5th. Had been on 15 mg since Nov 2024. In April came off of metformin  completely. First episode in May. In August had a spell and didn't take Mounjaro for 3 weeks  Only correlation with  Mounjaro was October when she increased from 5 to 7.5.   Had been on allopurinol  for gout.   EGD Dec 2025:  Normal esophagus ,normal duodenum, portal gastropathy   No FH liver disease.   0.70 points Advanced fibrosis excluded Approximate fibrosis stage: [place] 0-1 (Sterling et al 2006)  20 min "

## 2024-04-09 NOTE — Telephone Encounter (Signed)
 Natalie Hernandez, you are scheduled for a virtual visit with your provider today.  Just as we do with appointments in the office, we must obtain your consent to participate.  Your consent will be active for this visit and any virtual visit you may have with one of our providers in the next 365 days.  If you have a MyChart account, I can also send a copy of this consent to you electronically.  All virtual visits are billed to your insurance company just like a traditional visit in the office.  As this is a virtual visit, video technology does not allow for your provider to perform a traditional examination.  This may limit your provider's ability to fully assess your condition.  If your provider identifies any concerns that need to be evaluated in person or the need to arrange testing such as labs, EKG, etc, we will make arrangements to do so.  Although advances in technology are sophisticated, we cannot ensure that it will always work on either your end or our end.  If the connection with a video visit is poor, we may have to switch to a telephone visit.  With either a video or telephone visit, we are not always able to ensure that we have a secure connection.   I need to obtain your verbal consent now.   Are you willing to proceed with your visit today?

## 2024-04-09 NOTE — Patient Instructions (Signed)
 We have ordered an ultrasound of your abdomen, and I have ordered the fibrosis lab.   Continue pantoprazole  once daily, 30 minutes before breakfast.  You can take dicyclomine  at the start of cramping or diarrhea. Monitor for any constipation, dry mouth, dizziness.  Let's hold off on Mounjaro or anything similar for another 3 months. Keep a stool diary and food diary.  We can see how you are in 3 months, but please message me with any concerns in the meantime!  Have a great New Year!   I enjoyed seeing you again today! I value our relationship and want to provide genuine, compassionate, and quality care. You may receive a survey regarding your visit with me, and I welcome your feedback! Thanks so much for taking the time to complete this. I look forward to seeing you again.      Therisa MICAEL Stager, PhD, ANP-BC [facility]

## 2024-04-12 ENCOUNTER — Ambulatory Visit (HOSPITAL_COMMUNITY)
Admission: RE | Admit: 2024-04-12 | Discharge: 2024-04-12 | Disposition: A | Source: Ambulatory Visit | Attending: Gastroenterology | Admitting: Gastroenterology

## 2024-04-12 DIAGNOSIS — K3189 Other diseases of stomach and duodenum: Secondary | ICD-10-CM | POA: Insufficient documentation

## 2024-04-12 DIAGNOSIS — K766 Portal hypertension: Secondary | ICD-10-CM | POA: Diagnosis present

## 2024-04-15 ENCOUNTER — Ambulatory Visit: Payer: Self-pay | Admitting: Adult Health

## 2024-04-16 ENCOUNTER — Ambulatory Visit (HOSPITAL_COMMUNITY)

## 2024-04-16 LAB — ENHANCED LIVER FIBROSIS (ELF): ELF(TM) Score: 9.76

## 2024-04-19 ENCOUNTER — Telehealth: Payer: Self-pay | Admitting: *Deleted

## 2024-04-19 NOTE — Telephone Encounter (Signed)
 Error

## 2024-05-06 ENCOUNTER — Ambulatory Visit: Payer: Self-pay | Admitting: Gastroenterology

## 2024-05-23 ENCOUNTER — Ambulatory Visit: Admitting: Gastroenterology

## 2024-06-14 ENCOUNTER — Ambulatory Visit: Admitting: Adult Health

## 2024-06-27 ENCOUNTER — Ambulatory Visit: Admitting: Gastroenterology
